# Patient Record
Sex: Male | Born: 1990 | Race: Black or African American | Hispanic: No | Smoking: Never smoker
Health system: Southern US, Community
[De-identification: ages and names within clinical notes are randomized; demographics above are authoritative.]

## PROBLEM LIST (undated history)

## (undated) DIAGNOSIS — W3400XA Accidental discharge from unspecified firearms or gun, initial encounter: Secondary | ICD-10-CM

## (undated) DIAGNOSIS — Z9289 Personal history of other medical treatment: Secondary | ICD-10-CM

## (undated) DIAGNOSIS — Z8489 Family history of other specified conditions: Secondary | ICD-10-CM

## (undated) DIAGNOSIS — K56609 Unspecified intestinal obstruction, unspecified as to partial versus complete obstruction: Secondary | ICD-10-CM

---
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
## 2010-11-13 ENCOUNTER — Encounter: Payer: Self-pay | Admitting: General Surgery

## 2011-02-02 LAB — CULTURE, ROUTINE-ABSCESS

## 2011-02-02 LAB — COMPREHENSIVE METABOLIC PANEL
ALT: 36 U/L (ref 0–53)
AST: 25 U/L (ref 0–37)
Albumin: 2 g/dL — ABNORMAL LOW (ref 3.5–5.2)
Albumin: 2.1 g/dL — ABNORMAL LOW (ref 3.5–5.2)
Albumin: 2.4 g/dL — ABNORMAL LOW (ref 3.5–5.2)
Alkaline Phosphatase: 137 U/L (ref 52–171)
BUN: 10 mg/dL (ref 6–23)
BUN: 10 mg/dL (ref 6–23)
Calcium: 7.9 mg/dL — ABNORMAL LOW (ref 8.4–10.5)
Chloride: 100 mEq/L (ref 96–112)
Chloride: 99 mEq/L (ref 96–112)
Creatinine, Ser: 0.71 mg/dL (ref 0.4–1.5)
Creatinine, Ser: 0.84 mg/dL (ref 0.4–1.5)
Glucose, Bld: 121 mg/dL — ABNORMAL HIGH (ref 70–99)
Potassium: 3.9 mEq/L (ref 3.5–5.1)
Sodium: 133 mEq/L — ABNORMAL LOW (ref 135–145)
Total Bilirubin: 0.6 mg/dL (ref 0.3–1.2)
Total Bilirubin: 1.2 mg/dL (ref 0.3–1.2)
Total Protein: 5.4 g/dL — ABNORMAL LOW (ref 6.0–8.3)
Total Protein: 6.3 g/dL (ref 6.0–8.3)

## 2011-02-02 LAB — DIFFERENTIAL
Basophils Absolute: 0 10*3/uL (ref 0.0–0.1)
Basophils Absolute: 0 10*3/uL (ref 0.0–0.1)
Basophils Relative: 0 % (ref 0–1)
Eosinophils Absolute: 0.1 10*3/uL (ref 0.0–1.2)
Eosinophils Relative: 0 % (ref 0–5)
Lymphocytes Relative: 5 % — ABNORMAL LOW (ref 24–48)
Lymphocytes Relative: 6 % — ABNORMAL LOW (ref 24–48)
Lymphocytes Relative: 6 % — ABNORMAL LOW (ref 24–48)
Lymphs Abs: 0.9 10*3/uL — ABNORMAL LOW (ref 1.1–4.8)
Lymphs Abs: 1 10*3/uL — ABNORMAL LOW (ref 1.1–4.8)
Neutro Abs: 15 10*3/uL — ABNORMAL HIGH (ref 1.7–8.0)
Neutro Abs: 15.2 10*3/uL — ABNORMAL HIGH (ref 1.7–8.0)
Neutro Abs: 16.9 10*3/uL — ABNORMAL HIGH (ref 1.7–8.0)
Neutrophils Relative %: 88 % — ABNORMAL HIGH (ref 43–71)
Neutrophils Relative %: 88 % — ABNORMAL HIGH (ref 43–71)
Neutrophils Relative %: 90 % — ABNORMAL HIGH (ref 43–71)

## 2011-02-02 LAB — CBC
HCT: 23 % — ABNORMAL LOW (ref 36.0–49.0)
HCT: 31 % — ABNORMAL LOW (ref 36.0–49.0)
HCT: 31.1 % — ABNORMAL LOW (ref 36.0–49.0)
Hemoglobin: 10.5 g/dL — ABNORMAL LOW (ref 12.0–16.0)
MCHC: 34.2 g/dL (ref 31.0–37.0)
MCHC: 34.4 g/dL (ref 31.0–37.0)
MCHC: 34.7 g/dL (ref 31.0–37.0)
MCV: 87.3 fL (ref 78.0–98.0)
MCV: 87.6 fL (ref 78.0–98.0)
MCV: 88.2 fL (ref 78.0–98.0)
MCV: 88.3 fL (ref 78.0–98.0)
Platelets: 192 10*3/uL (ref 150–400)
Platelets: 236 10*3/uL (ref 150–400)
Platelets: 429 10*3/uL — ABNORMAL HIGH (ref 150–400)
Platelets: 493 10*3/uL — ABNORMAL HIGH (ref 150–400)
Platelets: 539 10*3/uL — ABNORMAL HIGH (ref 150–400)
Platelets: 582 10*3/uL — ABNORMAL HIGH (ref 150–400)
RBC: 2.64 MIL/uL — ABNORMAL LOW (ref 3.80–5.70)
RBC: 3 MIL/uL — ABNORMAL LOW (ref 3.80–5.70)
RBC: 3.45 MIL/uL — ABNORMAL LOW (ref 3.80–5.70)
RBC: 3.49 MIL/uL — ABNORMAL LOW (ref 3.80–5.70)
RDW: 16.9 % — ABNORMAL HIGH (ref 11.4–15.5)
RDW: 17.4 % — ABNORMAL HIGH (ref 11.4–15.5)
RDW: 17.4 % — ABNORMAL HIGH (ref 11.4–15.5)
RDW: 17.7 % — ABNORMAL HIGH (ref 11.4–15.5)
WBC: 13.4 10*3/uL (ref 4.5–13.5)
WBC: 13.7 10*3/uL — ABNORMAL HIGH (ref 4.5–13.5)
WBC: 15.2 10*3/uL — ABNORMAL HIGH (ref 4.5–13.5)
WBC: 17.1 10*3/uL — ABNORMAL HIGH (ref 4.5–13.5)
WBC: 18.8 10*3/uL — ABNORMAL HIGH (ref 4.5–13.5)
WBC: 9.1 10*3/uL (ref 4.5–13.5)

## 2011-02-02 LAB — GLUCOSE, CAPILLARY
Glucose-Capillary: 105 mg/dL — ABNORMAL HIGH (ref 70–99)
Glucose-Capillary: 105 mg/dL — ABNORMAL HIGH (ref 70–99)
Glucose-Capillary: 116 mg/dL — ABNORMAL HIGH (ref 70–99)
Glucose-Capillary: 119 mg/dL — ABNORMAL HIGH (ref 70–99)
Glucose-Capillary: 120 mg/dL — ABNORMAL HIGH (ref 70–99)
Glucose-Capillary: 121 mg/dL — ABNORMAL HIGH (ref 70–99)
Glucose-Capillary: 122 mg/dL — ABNORMAL HIGH (ref 70–99)
Glucose-Capillary: 123 mg/dL — ABNORMAL HIGH (ref 70–99)
Glucose-Capillary: 124 mg/dL — ABNORMAL HIGH (ref 70–99)
Glucose-Capillary: 125 mg/dL — ABNORMAL HIGH (ref 70–99)
Glucose-Capillary: 128 mg/dL — ABNORMAL HIGH (ref 70–99)
Glucose-Capillary: 128 mg/dL — ABNORMAL HIGH (ref 70–99)
Glucose-Capillary: 135 mg/dL — ABNORMAL HIGH (ref 70–99)
Glucose-Capillary: 138 mg/dL — ABNORMAL HIGH (ref 70–99)
Glucose-Capillary: 142 mg/dL — ABNORMAL HIGH (ref 70–99)
Glucose-Capillary: 142 mg/dL — ABNORMAL HIGH (ref 70–99)
Glucose-Capillary: 153 mg/dL — ABNORMAL HIGH (ref 70–99)
Glucose-Capillary: 80 mg/dL (ref 70–99)
Glucose-Capillary: 87 mg/dL (ref 70–99)
Glucose-Capillary: 96 mg/dL (ref 70–99)

## 2011-02-02 LAB — BASIC METABOLIC PANEL
BUN: 10 mg/dL (ref 6–23)
BUN: 10 mg/dL (ref 6–23)
Calcium: 8 mg/dL — ABNORMAL LOW (ref 8.4–10.5)
Calcium: 8.3 mg/dL — ABNORMAL LOW (ref 8.4–10.5)
Calcium: 8.6 mg/dL (ref 8.4–10.5)
Chloride: 103 mEq/L (ref 96–112)
Creatinine, Ser: 0.77 mg/dL (ref 0.4–1.5)
Creatinine, Ser: 0.91 mg/dL (ref 0.4–1.5)
Glucose, Bld: 122 mg/dL — ABNORMAL HIGH (ref 70–99)
Potassium: 4.1 mEq/L (ref 3.5–5.1)
Sodium: 133 mEq/L — ABNORMAL LOW (ref 135–145)

## 2011-02-02 LAB — APTT: aPTT: 41 seconds — ABNORMAL HIGH (ref 24–37)

## 2011-02-02 LAB — PHOSPHORUS
Phosphorus: 4.8 mg/dL — ABNORMAL HIGH (ref 2.3–4.6)
Phosphorus: 4.8 mg/dL — ABNORMAL HIGH (ref 2.3–4.6)
Phosphorus: 4.9 mg/dL — ABNORMAL HIGH (ref 2.3–4.6)

## 2011-02-02 LAB — CROSSMATCH
ABO/RH(D): O POS
Antibody Screen: NEGATIVE

## 2011-02-02 LAB — PREALBUMIN: Prealbumin: 16.7 mg/dL — ABNORMAL LOW (ref 18.0–45.0)

## 2011-02-02 LAB — MAGNESIUM
Magnesium: 2 mg/dL (ref 1.5–2.5)
Magnesium: 2.1 mg/dL (ref 1.5–2.5)

## 2011-02-02 LAB — TRIGLYCERIDES
Triglycerides: 55 mg/dL (ref <150)
Triglycerides: 56 mg/dL (ref <150)

## 2011-02-02 LAB — CHOLESTEROL, TOTAL: Cholesterol: 63 mg/dL (ref 0–169)

## 2011-02-02 LAB — PROTIME-INR: Prothrombin Time: 18.9 seconds — ABNORMAL HIGH (ref 11.6–15.2)

## 2011-02-07 LAB — COMPREHENSIVE METABOLIC PANEL
ALT: 15 U/L (ref 0–53)
ALT: 13 U/L (ref 0–53)
AST: 17 U/L (ref 0–37)
AST: 14 U/L (ref 0–37)
Albumin: 3.8 g/dL (ref 3.5–5.2)
Albumin: 3.2 g/dL — ABNORMAL LOW (ref 3.5–5.2)
Albumin: 3.5 g/dL (ref 3.5–5.2)
Alkaline Phosphatase: 54 U/L (ref 52–171)
Alkaline Phosphatase: 41 U/L — ABNORMAL LOW (ref 52–171)
Alkaline Phosphatase: 54 U/L (ref 52–171)
BUN: 10 mg/dL (ref 6–23)
BUN: 14 mg/dL (ref 6–23)
BUN: 5 mg/dL — ABNORMAL LOW (ref 6–23)
CO2: 27 mEq/L (ref 19–32)
Calcium: 8 mg/dL — ABNORMAL LOW (ref 8.4–10.5)
Calcium: 8.6 mg/dL (ref 8.4–10.5)
Calcium: 8.9 mg/dL (ref 8.4–10.5)
Chloride: 101 mEq/L (ref 96–112)
Chloride: 102 mEq/L (ref 96–112)
Creatinine, Ser: 1.02 mg/dL (ref 0.4–1.5)
Creatinine, Ser: 0.74 mg/dL (ref 0.4–1.5)
Creatinine, Ser: 0.82 mg/dL (ref 0.4–1.5)
Creatinine, Ser: 0.92 mg/dL (ref 0.4–1.5)
Glucose, Bld: 118 mg/dL — ABNORMAL HIGH (ref 70–99)
Glucose, Bld: 136 mg/dL — ABNORMAL HIGH (ref 70–99)
Glucose, Bld: 160 mg/dL — ABNORMAL HIGH (ref 70–99)
Potassium: 3.7 mEq/L (ref 3.5–5.1)
Potassium: 3.7 mEq/L (ref 3.5–5.1)
Potassium: 4.2 mEq/L (ref 3.5–5.1)
Sodium: 137 mEq/L (ref 135–145)
Sodium: 137 mEq/L (ref 135–145)
Total Bilirubin: 0.6 mg/dL (ref 0.3–1.2)
Total Bilirubin: 0.4 mg/dL (ref 0.3–1.2)
Total Protein: 5.2 g/dL — ABNORMAL LOW (ref 6.0–8.3)
Total Protein: 5.7 g/dL — ABNORMAL LOW (ref 6.0–8.3)
Total Protein: 6.9 g/dL (ref 6.0–8.3)

## 2011-02-07 LAB — GLUCOSE, CAPILLARY
Glucose-Capillary: 105 mg/dL — ABNORMAL HIGH (ref 70–99)
Glucose-Capillary: 111 mg/dL — ABNORMAL HIGH (ref 70–99)
Glucose-Capillary: 111 mg/dL — ABNORMAL HIGH (ref 70–99)
Glucose-Capillary: 112 mg/dL — ABNORMAL HIGH (ref 70–99)
Glucose-Capillary: 112 mg/dL — ABNORMAL HIGH (ref 70–99)
Glucose-Capillary: 116 mg/dL — ABNORMAL HIGH (ref 70–99)
Glucose-Capillary: 121 mg/dL — ABNORMAL HIGH (ref 70–99)
Glucose-Capillary: 123 mg/dL — ABNORMAL HIGH (ref 70–99)
Glucose-Capillary: 125 mg/dL — ABNORMAL HIGH (ref 70–99)
Glucose-Capillary: 127 mg/dL — ABNORMAL HIGH (ref 70–99)
Glucose-Capillary: 127 mg/dL — ABNORMAL HIGH (ref 70–99)
Glucose-Capillary: 129 mg/dL — ABNORMAL HIGH (ref 70–99)
Glucose-Capillary: 130 mg/dL — ABNORMAL HIGH (ref 70–99)
Glucose-Capillary: 132 mg/dL — ABNORMAL HIGH (ref 70–99)
Glucose-Capillary: 133 mg/dL — ABNORMAL HIGH (ref 70–99)
Glucose-Capillary: 133 mg/dL — ABNORMAL HIGH (ref 70–99)
Glucose-Capillary: 135 mg/dL — ABNORMAL HIGH (ref 70–99)
Glucose-Capillary: 135 mg/dL — ABNORMAL HIGH (ref 70–99)
Glucose-Capillary: 139 mg/dL — ABNORMAL HIGH (ref 70–99)
Glucose-Capillary: 139 mg/dL — ABNORMAL HIGH (ref 70–99)
Glucose-Capillary: 143 mg/dL — ABNORMAL HIGH (ref 70–99)
Glucose-Capillary: 144 mg/dL — ABNORMAL HIGH (ref 70–99)
Glucose-Capillary: 146 mg/dL — ABNORMAL HIGH (ref 70–99)
Glucose-Capillary: 147 mg/dL — ABNORMAL HIGH (ref 70–99)
Glucose-Capillary: 148 mg/dL — ABNORMAL HIGH (ref 70–99)
Glucose-Capillary: 152 mg/dL — ABNORMAL HIGH (ref 70–99)
Glucose-Capillary: 155 mg/dL — ABNORMAL HIGH (ref 70–99)
Glucose-Capillary: 159 mg/dL — ABNORMAL HIGH (ref 70–99)
Glucose-Capillary: 160 mg/dL — ABNORMAL HIGH (ref 70–99)
Glucose-Capillary: 163 mg/dL — ABNORMAL HIGH (ref 70–99)
Glucose-Capillary: 167 mg/dL — ABNORMAL HIGH (ref 70–99)
Glucose-Capillary: 169 mg/dL — ABNORMAL HIGH (ref 70–99)
Glucose-Capillary: 175 mg/dL — ABNORMAL HIGH (ref 70–99)
Glucose-Capillary: 182 mg/dL — ABNORMAL HIGH (ref 70–99)
Glucose-Capillary: 220 mg/dL — ABNORMAL HIGH (ref 70–99)
Glucose-Capillary: 96 mg/dL (ref 70–99)

## 2011-02-07 LAB — BASIC METABOLIC PANEL
BUN: 10 mg/dL (ref 6–23)
BUN: 17 mg/dL (ref 6–23)
BUN: 6 mg/dL (ref 6–23)
BUN: 12 mg/dL (ref 6–23)
BUN: 13 mg/dL (ref 6–23)
BUN: 3 mg/dL — ABNORMAL LOW (ref 6–23)
CO2: 24 mEq/L (ref 19–32)
CO2: 24 mEq/L (ref 19–32)
CO2: 27 mEq/L (ref 19–32)
CO2: 28 mEq/L (ref 19–32)
CO2: 28 mEq/L (ref 19–32)
CO2: 25 mEq/L (ref 19–32)
CO2: 25 mEq/L (ref 19–32)
CO2: 26 mEq/L (ref 19–32)
CO2: 27 mEq/L (ref 19–32)
Calcium: 7.4 mg/dL — ABNORMAL LOW (ref 8.4–10.5)
Calcium: 7.5 mg/dL — ABNORMAL LOW (ref 8.4–10.5)
Calcium: 7.7 mg/dL — ABNORMAL LOW (ref 8.4–10.5)
Calcium: 8.3 mg/dL — ABNORMAL LOW (ref 8.4–10.5)
Calcium: 8.8 mg/dL (ref 8.4–10.5)
Calcium: 8.4 mg/dL (ref 8.4–10.5)
Calcium: 8.7 mg/dL (ref 8.4–10.5)
Calcium: 8.7 mg/dL (ref 8.4–10.5)
Chloride: 102 mEq/L (ref 96–112)
Chloride: 102 mEq/L (ref 96–112)
Chloride: 110 mEq/L (ref 96–112)
Chloride: 100 mEq/L (ref 96–112)
Chloride: 100 mEq/L (ref 96–112)
Chloride: 101 mEq/L (ref 96–112)
Chloride: 101 mEq/L (ref 96–112)
Chloride: 103 mEq/L (ref 96–112)
Chloride: 97 mEq/L (ref 96–112)
Chloride: 98 mEq/L (ref 96–112)
Creatinine, Ser: 0.82 mg/dL (ref 0.4–1.5)
Creatinine, Ser: 0.99 mg/dL (ref 0.4–1.5)
Creatinine, Ser: 1.49 mg/dL (ref 0.4–1.5)
Creatinine, Ser: 0.77 mg/dL (ref 0.4–1.5)
Creatinine, Ser: 0.81 mg/dL (ref 0.4–1.5)
Creatinine, Ser: 0.93 mg/dL (ref 0.4–1.5)
Glucose, Bld: 103 mg/dL — ABNORMAL HIGH (ref 70–99)
Glucose, Bld: 124 mg/dL — ABNORMAL HIGH (ref 70–99)
Glucose, Bld: 116 mg/dL — ABNORMAL HIGH (ref 70–99)
Glucose, Bld: 123 mg/dL — ABNORMAL HIGH (ref 70–99)
Glucose, Bld: 125 mg/dL — ABNORMAL HIGH (ref 70–99)
Glucose, Bld: 131 mg/dL — ABNORMAL HIGH (ref 70–99)
Glucose, Bld: 163 mg/dL — ABNORMAL HIGH (ref 70–99)
Glucose, Bld: 186 mg/dL — ABNORMAL HIGH (ref 70–99)
Potassium: 4.7 mEq/L (ref 3.5–5.1)
Potassium: 6.7 mEq/L (ref 3.5–5.1)
Potassium: 4.1 mEq/L (ref 3.5–5.1)
Potassium: 4.3 mEq/L (ref 3.5–5.1)
Potassium: 4.5 mEq/L (ref 3.5–5.1)
Potassium: 4.6 mEq/L (ref 3.5–5.1)
Potassium: 4.8 mEq/L (ref 3.5–5.1)
Sodium: 133 mEq/L — ABNORMAL LOW (ref 135–145)
Sodium: 133 mEq/L — ABNORMAL LOW (ref 135–145)
Sodium: 134 mEq/L — ABNORMAL LOW (ref 135–145)
Sodium: 136 mEq/L (ref 135–145)
Sodium: 138 mEq/L (ref 135–145)
Sodium: 131 mEq/L — ABNORMAL LOW (ref 135–145)
Sodium: 133 mEq/L — ABNORMAL LOW (ref 135–145)
Sodium: 134 mEq/L — ABNORMAL LOW (ref 135–145)
Sodium: 135 mEq/L (ref 135–145)
Sodium: 135 mEq/L (ref 135–145)

## 2011-02-07 LAB — URINALYSIS, ROUTINE W REFLEX MICROSCOPIC
Bilirubin Urine: NEGATIVE
Glucose, UA: NEGATIVE mg/dL
Glucose, UA: 100 mg/dL — AB
Hgb urine dipstick: NEGATIVE
Nitrite: NEGATIVE
Specific Gravity, Urine: >1.046 — ABNORMAL HIGH (ref 1.005–1.030)
Specific Gravity, Urine: 1.028 (ref 1.005–1.030)
pH: 7 (ref 5.0–8.0)

## 2011-02-07 LAB — DIFFERENTIAL
Basophils Absolute: 0 10*3/uL (ref 0.0–0.1)
Basophils Absolute: 0.1 10*3/uL (ref 0.0–0.1)
Basophils Absolute: 0 10*3/uL (ref 0.0–0.1)
Basophils Absolute: 0 10*3/uL (ref 0.0–0.1)
Basophils Absolute: 0 10*3/uL (ref 0.0–0.1)
Basophils Absolute: 0 10*3/uL (ref 0.0–0.1)
Basophils Relative: 0 % (ref 0–1)
Basophils Relative: 0 % (ref 0–1)
Basophils Relative: 0 % (ref 0–1)
Basophils Relative: 0 % (ref 0–1)
Basophils Relative: 1 % (ref 0–1)
Eosinophils Absolute: 0 10*3/uL (ref 0.0–1.2)
Eosinophils Absolute: 0 10*3/uL (ref 0.0–1.2)
Eosinophils Absolute: 0 10*3/uL (ref 0.0–1.2)
Eosinophils Absolute: 0.1 10*3/uL (ref 0.0–1.2)
Eosinophils Relative: 0 % (ref 0–5)
Eosinophils Relative: 0 % (ref 0–5)
Eosinophils Relative: 0 % (ref 0–5)
Eosinophils Relative: 0 % (ref 0–5)
Eosinophils Relative: 1 % (ref 0–5)
Lymphocytes Relative: 15 % — ABNORMAL LOW (ref 24–48)
Lymphocytes Relative: 6 % — ABNORMAL LOW (ref 24–48)
Lymphocytes Relative: 14 % — ABNORMAL LOW (ref 24–48)
Lymphocytes Relative: 6 % — ABNORMAL LOW (ref 24–48)
Lymphocytes Relative: 8 % — ABNORMAL LOW (ref 24–48)
Lymphocytes Relative: 9 % — ABNORMAL LOW (ref 24–48)
Lymphs Abs: 1.5 10*3/uL (ref 1.1–4.8)
Lymphs Abs: 0.5 10*3/uL — ABNORMAL LOW (ref 1.1–4.8)
Lymphs Abs: 0.6 10*3/uL — ABNORMAL LOW (ref 1.1–4.8)
Lymphs Abs: 0.9 10*3/uL — ABNORMAL LOW (ref 1.1–4.8)
Lymphs Abs: 1 10*3/uL — ABNORMAL LOW (ref 1.1–4.8)
Lymphs Abs: 1.3 10*3/uL (ref 1.1–4.8)
Monocytes Absolute: 0.5 10*3/uL (ref 0.2–1.2)
Monocytes Absolute: 0.6 10*3/uL (ref 0.2–1.2)
Monocytes Absolute: 0.5 10*3/uL (ref 0.2–1.2)
Monocytes Absolute: 2.2 10*3/uL — ABNORMAL HIGH (ref 0.2–1.2)
Monocytes Relative: 16 % — ABNORMAL HIGH (ref 3–11)
Monocytes Relative: 17 % — ABNORMAL HIGH (ref 3–11)
Monocytes Relative: 5 % (ref 3–11)
Neutro Abs: 7.6 10*3/uL (ref 1.7–8.0)
Neutro Abs: 11.4 10*3/uL — ABNORMAL HIGH (ref 1.7–8.0)
Neutro Abs: 7.3 10*3/uL (ref 1.7–8.0)
Neutro Abs: 7.4 10*3/uL (ref 1.7–8.0)
Neutro Abs: 9 10*3/uL — ABNORMAL HIGH (ref 1.7–8.0)
Neutrophils Relative %: 70 % (ref 43–71)
Neutrophils Relative %: 74 % — ABNORMAL HIGH (ref 43–71)
Neutrophils Relative %: 79 % — ABNORMAL HIGH (ref 43–71)
Neutrophils Relative %: 79 % — ABNORMAL HIGH (ref 43–71)
Neutrophils Relative %: 86 % — ABNORMAL HIGH (ref 43–71)
Neutrophils Relative %: 90 % — ABNORMAL HIGH (ref 43–71)
WBC Morphology: INCREASED

## 2011-02-07 LAB — URINE CULTURE
Colony Count: NO GROWTH
Colony Count: NO GROWTH
Culture: NO GROWTH

## 2011-02-07 LAB — MAGNESIUM: Magnesium: 2.1 mg/dL (ref 1.5–2.5)

## 2011-02-07 LAB — PHOSPHORUS
Phosphorus: 3 mg/dL (ref 2.3–4.6)
Phosphorus: 3.9 mg/dL (ref 2.3–4.6)
Phosphorus: 4.1 mg/dL (ref 2.3–4.6)

## 2011-02-07 LAB — CBC
HCT: 22.5 % — ABNORMAL LOW (ref 36.0–49.0)
HCT: 25.7 % — ABNORMAL LOW (ref 36.0–49.0)
HCT: 32.5 % — ABNORMAL LOW (ref 36.0–49.0)
HCT: 46.3 % (ref 36.0–49.0)
HCT: 20.8 % — ABNORMAL LOW (ref 36.0–49.0)
HCT: 24.9 % — ABNORMAL LOW (ref 36.0–49.0)
HCT: 25.3 % — ABNORMAL LOW (ref 36.0–49.0)
HCT: 25.5 % — ABNORMAL LOW (ref 36.0–49.0)
HCT: 26.8 % — ABNORMAL LOW (ref 36.0–49.0)
HCT: 28.1 % — ABNORMAL LOW (ref 36.0–49.0)
HCT: 28.9 % — ABNORMAL LOW (ref 36.0–49.0)
Hemoglobin: 11.4 g/dL — ABNORMAL LOW (ref 12.0–16.0)
Hemoglobin: 15.7 g/dL (ref 12.0–16.0)
Hemoglobin: 6.3 g/dL — CL (ref 12.0–16.0)
Hemoglobin: 7.8 g/dL — CL (ref 12.0–16.0)
Hemoglobin: 8.6 g/dL — ABNORMAL LOW (ref 12.0–16.0)
Hemoglobin: 8.9 g/dL — ABNORMAL LOW (ref 12.0–16.0)
Hemoglobin: 7 g/dL — CL (ref 12.0–16.0)
Hemoglobin: 8.6 g/dL — ABNORMAL LOW (ref 12.0–16.0)
Hemoglobin: 8.6 g/dL — ABNORMAL LOW (ref 12.0–16.0)
Hemoglobin: 8.6 g/dL — ABNORMAL LOW (ref 12.0–16.0)
Hemoglobin: 8.9 g/dL — ABNORMAL LOW (ref 12.0–16.0)
Hemoglobin: 9.4 g/dL — ABNORMAL LOW (ref 12.0–16.0)
Hemoglobin: 9.8 g/dL — ABNORMAL LOW (ref 12.0–16.0)
MCHC: 34.7 g/dL (ref 31.0–37.0)
MCHC: 34.9 g/dL (ref 31.0–37.0)
MCHC: 35.2 g/dL (ref 31.0–37.0)
MCHC: 35.3 g/dL (ref 31.0–37.0)
MCHC: 35.3 g/dL (ref 31.0–37.0)
MCHC: 33.4 g/dL (ref 31.0–37.0)
MCHC: 33.5 g/dL (ref 31.0–37.0)
MCHC: 34 g/dL (ref 31.0–37.0)
MCHC: 34.4 g/dL (ref 31.0–37.0)
MCHC: 34.4 g/dL (ref 31.0–37.0)
MCHC: 34.4 g/dL (ref 31.0–37.0)
MCHC: 34.5 g/dL (ref 31.0–37.0)
MCHC: 34.9 g/dL (ref 31.0–37.0)
MCHC: 35.2 g/dL (ref 31.0–37.0)
MCV: 93.1 fL (ref 78.0–98.0)
MCV: 93.5 fL (ref 78.0–98.0)
MCV: 94.4 fL (ref 78.0–98.0)
MCV: 87.4 fL (ref 78.0–98.0)
MCV: 87.4 fL (ref 78.0–98.0)
MCV: 87.5 fL (ref 78.0–98.0)
MCV: 87.7 fL (ref 78.0–98.0)
MCV: 88.6 fL (ref 78.0–98.0)
MCV: 88.8 fL (ref 78.0–98.0)
MCV: 90.1 fL (ref 78.0–98.0)
MCV: 90.1 fL (ref 78.0–98.0)
MCV: 91.7 fL (ref 78.0–98.0)
MCV: 92 fL (ref 78.0–98.0)
Platelets: 123 10*3/uL — ABNORMAL LOW (ref 150–400)
Platelets: 133 10*3/uL — ABNORMAL LOW (ref 150–400)
Platelets: 168 10*3/uL (ref 150–400)
Platelets: 500 10*3/uL — ABNORMAL HIGH (ref 150–400)
Platelets: 289 10*3/uL (ref 150–400)
Platelets: 321 10*3/uL (ref 150–400)
Platelets: 416 10*3/uL — ABNORMAL HIGH (ref 150–400)
Platelets: 431 10*3/uL — ABNORMAL HIGH (ref 150–400)
Platelets: 440 10*3/uL — ABNORMAL HIGH (ref 150–400)
Platelets: 502 10*3/uL — ABNORMAL HIGH (ref 150–400)
RBC: 2.42 MIL/uL — ABNORMAL LOW (ref 3.80–5.70)
RBC: 2.44 MIL/uL — ABNORMAL LOW (ref 3.80–5.70)
RBC: 2.64 MIL/uL — ABNORMAL LOW (ref 3.80–5.70)
RBC: 2.8 MIL/uL — ABNORMAL LOW (ref 3.80–5.70)
RBC: 3.45 MIL/uL — ABNORMAL LOW (ref 3.80–5.70)
RBC: 4.92 MIL/uL (ref 3.80–5.70)
RBC: 2.74 MIL/uL — ABNORMAL LOW (ref 3.80–5.70)
RBC: 2.8 MIL/uL — ABNORMAL LOW (ref 3.80–5.70)
RBC: 2.81 MIL/uL — ABNORMAL LOW (ref 3.80–5.70)
RBC: 2.89 MIL/uL — ABNORMAL LOW (ref 3.80–5.70)
RBC: 2.98 MIL/uL — ABNORMAL LOW (ref 3.80–5.70)
RBC: 3.14 MIL/uL — ABNORMAL LOW (ref 3.80–5.70)
RBC: 3.4 MIL/uL — ABNORMAL LOW (ref 3.80–5.70)
RDW: 12.9 % (ref 11.4–15.5)
RDW: 13 % (ref 11.4–15.5)
RDW: 13.2 % (ref 11.4–15.5)
RDW: 13.2 % (ref 11.4–15.5)
RDW: 13.4 % (ref 11.4–15.5)
RDW: 13.4 % (ref 11.4–15.5)
RDW: 14 % (ref 11.4–15.5)
RDW: 15.1 % (ref 11.4–15.5)
RDW: 15.2 % (ref 11.4–15.5)
RDW: 15.9 % — ABNORMAL HIGH (ref 11.4–15.5)
RDW: 16.1 % — ABNORMAL HIGH (ref 11.4–15.5)
WBC: 10.1 10*3/uL (ref 4.5–13.5)
WBC: 10.5 10*3/uL (ref 4.5–13.5)
WBC: 20.9 10*3/uL — ABNORMAL HIGH (ref 4.5–13.5)
WBC: 6.5 10*3/uL (ref 4.5–13.5)
WBC: 8.7 10*3/uL (ref 4.5–13.5)
WBC: 9.7 10*3/uL (ref 4.5–13.5)
WBC: 10 10*3/uL (ref 4.5–13.5)
WBC: 11 10*3/uL (ref 4.5–13.5)
WBC: 12 10*3/uL (ref 4.5–13.5)
WBC: 13.8 10*3/uL — ABNORMAL HIGH (ref 4.5–13.5)
WBC: 18.6 10*3/uL — ABNORMAL HIGH (ref 4.5–13.5)
WBC: 7.4 10*3/uL (ref 4.5–13.5)
WBC: 9.3 10*3/uL (ref 4.5–13.5)

## 2011-02-07 LAB — CULTURE, BLOOD (ROUTINE X 2): Culture: NO GROWTH

## 2011-02-07 LAB — WOUND CULTURE

## 2011-02-07 LAB — CROSSMATCH: Antibody Screen: NEGATIVE

## 2011-02-07 LAB — URINE MICROSCOPIC-ADD ON

## 2011-02-07 LAB — HEMOCCULT GUIAC POC 1CARD (OFFICE): Fecal Occult Bld: NEGATIVE

## 2011-02-07 LAB — APTT
aPTT: 26 seconds (ref 24–37)
aPTT: 30 seconds (ref 24–37)

## 2011-02-07 LAB — LACTIC ACID, PLASMA: Lactic Acid, Venous: 1.7 mmol/L (ref 0.5–2.2)

## 2011-02-07 LAB — CHOLESTEROL, TOTAL
Cholesterol: 78 mg/dL (ref 0–169)
Cholesterol: 91 mg/dL (ref 0–169)

## 2011-02-07 LAB — POCT I-STAT 4, (NA,K, GLUC, HGB,HCT)
Glucose, Bld: 162 mg/dL — ABNORMAL HIGH (ref 70–99)
HCT: 28 % — ABNORMAL LOW (ref 36.0–49.0)
Hemoglobin: 9.5 g/dL — ABNORMAL LOW (ref 12.0–16.0)
Potassium: 6.4 mEq/L (ref 3.5–5.1)
Sodium: 134 mEq/L — ABNORMAL LOW (ref 135–145)

## 2011-02-07 LAB — PROTIME-INR
INR: 1.1 (ref 0.00–1.49)
INR: 1.3 (ref 0.00–1.49)
Prothrombin Time: 14.8 seconds (ref 11.6–15.2)
Prothrombin Time: 18.3 seconds — ABNORMAL HIGH (ref 11.6–15.2)
Prothrombin Time: 16.4 seconds — ABNORMAL HIGH (ref 11.6–15.2)
Prothrombin Time: 16.8 seconds — ABNORMAL HIGH (ref 11.6–15.2)

## 2011-02-07 LAB — GASTRIC OCCULT BLOOD (1-CARD TO LAB)
Occult Blood, Gastric: POSITIVE — AB
Occult Blood, Gastric: POSITIVE — AB
pH, Gastric: 5

## 2011-02-07 LAB — TRIGLYCERIDES
Triglycerides: 40 mg/dL (ref <150)
Triglycerides: 70 mg/dL (ref <150)

## 2011-02-07 LAB — PREALBUMIN
Prealbumin: 15.4 mg/dL — ABNORMAL LOW (ref 18.0–45.0)
Prealbumin: 19.5 mg/dL (ref 18.0–45.0)

## 2011-02-07 LAB — HEMOGLOBIN AND HEMATOCRIT, BLOOD: HCT: 22.9 % — ABNORMAL LOW (ref 36.0–49.0)
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
## 2011-07-20 LAB — TYPE AND SCREEN: ABO/RH(D): O POS

## 2011-07-20 LAB — CBC
HCT: 23.7 — ABNORMAL LOW
HCT: 24.1 — ABNORMAL LOW
Hemoglobin: 14.4
Hemoglobin: 8.4 — ABNORMAL LOW
MCV: 90.7
Platelets: 113 — ABNORMAL LOW
RBC: 2.65 — ABNORMAL LOW
RBC: 4.51
WBC: 10.9
WBC: 11.9
WBC: 14 — ABNORMAL HIGH

## 2011-07-20 LAB — POCT I-STAT 4, (NA,K, GLUC, HGB,HCT)
Glucose, Bld: 134 — ABNORMAL HIGH
HCT: 30 — ABNORMAL LOW
Hemoglobin: 10.2 — ABNORMAL LOW
Operator id: 249321
Potassium: 4

## 2011-07-20 LAB — BASIC METABOLIC PANEL
BUN: 6
CO2: 28
Chloride: 99
Glucose, Bld: 147 — ABNORMAL HIGH
Potassium: 4.1
Potassium: 4.3
Sodium: 136

## 2011-07-20 LAB — POCT I-STAT, CHEM 8
BUN: 17
Calcium, Ion: 1.16
Chloride: 104
Glucose, Bld: 143 — ABNORMAL HIGH
HCT: 43
TCO2: 18

## 2011-07-20 LAB — POCT CARDIAC MARKERS: Troponin i, poc: <0.05

## 2011-07-20 LAB — PROTIME-INR
INR: 1.2
Prothrombin Time: 15.5 — ABNORMAL HIGH

## 2011-07-20 LAB — ABO/RH: ABO/RH(D): O POS

## 2011-07-21 LAB — COMPREHENSIVE METABOLIC PANEL
ALT: 28
AST: 30
Albumin: 2.1 — ABNORMAL LOW
Albumin: 3.2 — ABNORMAL LOW
Alkaline Phosphatase: 62
BUN: 12
CO2: 26
Chloride: 94 — ABNORMAL LOW
Creatinine, Ser: 0.85
Creatinine, Ser: 0.93
Potassium: 4.2
Sodium: 131 — ABNORMAL LOW
Total Bilirubin: 1
Total Protein: 6.9

## 2011-07-21 LAB — BASIC METABOLIC PANEL
BUN: 7
BUN: 8
BUN: 8
CO2: 24
CO2: 27
CO2: 28
CO2: 28
Calcium: 7.7 — ABNORMAL LOW
Calcium: 8.3 — ABNORMAL LOW
Chloride: 96
Chloride: 96
Chloride: 98
Creatinine, Ser: 0.91
Creatinine, Ser: 0.91
Glucose, Bld: 111 — ABNORMAL HIGH
Glucose, Bld: 118 — ABNORMAL HIGH
Glucose, Bld: 118 — ABNORMAL HIGH
Glucose, Bld: 127 — ABNORMAL HIGH
Potassium: 3.9
Potassium: 4.2
Potassium: 4.2
Sodium: 131 — ABNORMAL LOW
Sodium: 131 — ABNORMAL LOW
Sodium: 137

## 2011-07-21 LAB — DIFFERENTIAL
Eosinophils Absolute: 0
Eosinophils Relative: 0
Lymphocytes Relative: 12 — ABNORMAL LOW
Lymphocytes Relative: 9 — ABNORMAL LOW
Lymphs Abs: 1.2
Lymphs Abs: 1.4
Monocytes Absolute: 0.3
Monocytes Absolute: 1.3 — ABNORMAL HIGH
Monocytes Relative: 3
Monocytes Relative: 9
Neutro Abs: 10.7 — ABNORMAL HIGH

## 2011-07-21 LAB — CBC
HCT: 18.4 — ABNORMAL LOW
HCT: 18.8 — ABNORMAL LOW
HCT: 20.8 — ABNORMAL LOW
HCT: 22.3 — ABNORMAL LOW
HCT: 30.1 — ABNORMAL LOW
Hemoglobin: 6.5 — CL
Hemoglobin: 6.6 — CL
Hemoglobin: 7.2 — CL
Hemoglobin: 7.5 — CL
Hemoglobin: 7.7 — CL
MCHC: 34.3
MCHC: 34.5
MCHC: 34.9
MCV: 89.1
MCV: 89.5
MCV: 90
MCV: 90
MCV: 90.7
MCV: 91.2
Platelets: 182
Platelets: 200
Platelets: 316
Platelets: 455 — ABNORMAL HIGH
Platelets: 563 — ABNORMAL HIGH
Platelets: 914
RBC: 2.07 — ABNORMAL LOW
RBC: 2.18 — ABNORMAL LOW
RBC: 2.26 — ABNORMAL LOW
RBC: 2.31 — ABNORMAL LOW
RBC: 2.35 — ABNORMAL LOW
RDW: 13.1
RDW: 13.5
RDW: 13.5
RDW: 14
RDW: 16.8 — ABNORMAL HIGH
WBC: 14.5 — ABNORMAL HIGH
WBC: 15.6 — ABNORMAL HIGH
WBC: 16.6 — ABNORMAL HIGH
WBC: 20.1 — ABNORMAL HIGH

## 2011-07-21 LAB — URINALYSIS, ROUTINE W REFLEX MICROSCOPIC
Glucose, UA: NEGATIVE
Leukocytes, UA: NEGATIVE
Protein, ur: NEGATIVE
Specific Gravity, Urine: 1.017
Specific Gravity, Urine: 1.028
Urobilinogen, UA: 1
pH: 7

## 2011-07-21 LAB — CULTURE, BLOOD (ROUTINE X 2): Culture: NO GROWTH

## 2011-07-21 LAB — WOUND CULTURE: Gram Stain: NONE SEEN

## 2011-07-21 LAB — RETICULOCYTES
Retic Count, Absolute: 28.3
Retic Ct Pct: 1.1

## 2011-07-21 LAB — CLOSTRIDIUM DIFFICILE EIA: C difficile Toxins A+B, EIA: NEGATIVE

## 2011-07-21 LAB — URINE MICROSCOPIC-ADD ON

## 2011-07-21 LAB — URINE CULTURE

## 2011-07-21 LAB — FERRITIN: Ferritin: 182 (ref 22–322)

## 2011-07-21 LAB — FOLATE: Folate: 4.5

## 2014-06-22 ENCOUNTER — Ambulatory Visit (INDEPENDENT_AMBULATORY_CARE_PROVIDER_SITE_OTHER): Payer: 59 | Admitting: Emergency Medicine

## 2014-06-22 VITALS — BP 126/90 | HR 48 | Temp 97.5°F | Resp 16 | Ht 69.25 in | Wt 186.4 lb

## 2014-06-22 DIAGNOSIS — Z202 Contact with and (suspected) exposure to infections with a predominantly sexual mode of transmission: Secondary | ICD-10-CM

## 2014-06-22 DIAGNOSIS — Z0000 Encounter for general adult medical examination without abnormal findings: Secondary | ICD-10-CM

## 2014-06-22 LAB — LIPID PANEL
CHOL/HDL RATIO: 2.4 ratio
Cholesterol: 169 mg/dL (ref 0–200)
HDL: 69 mg/dL (ref >39)
LDL CALC: 82 mg/dL (ref 0–99)
TRIGLYCERIDES: 92 mg/dL (ref <150)
VLDL: 18 mg/dL (ref 0–40)

## 2014-06-22 LAB — POCT CBC
GRANULOCYTE PERCENT: 63.1 % (ref 37–80)
HEMATOCRIT: 47.2 % (ref 43.5–53.7)
Hemoglobin: 15.4 g/dL (ref 14.1–18.1)
LYMPH, POC: 2.1 (ref 0.6–3.4)
MCH, POC: 30.5 pg (ref 27–31.2)
MCHC: 32.7 g/dL (ref 31.8–35.4)
MCV: 93.2 fL (ref 80–97)
MID (cbc): 0.4 (ref 0–0.9)
MPV: 8.5 fL (ref 0–99.8)
PLATELET COUNT, POC: 170 10*3/uL (ref 142–424)
POC GRANULOCYTE: 4.4 (ref 2–6.9)
POC LYMPH %: 31 % (ref 10–50)
POC MID %: 5.9 % (ref 0–12)
RBC: 5.06 M/uL (ref 4.69–6.13)
RDW, POC: 1435 %
WBC: 6.9 10*3/uL (ref 4.6–10.2)

## 2014-06-22 LAB — COMPREHENSIVE METABOLIC PANEL
ALK PHOS: 59 U/L (ref 39–117)
ALT: 15 U/L (ref 0–53)
AST: 28 U/L (ref 0–37)
Albumin: 4.8 g/dL (ref 3.5–5.2)
BILIRUBIN TOTAL: 0.9 mg/dL (ref 0.2–1.2)
BUN: 17 mg/dL (ref 6–23)
CALCIUM: 9.6 mg/dL (ref 8.4–10.5)
CHLORIDE: 103 meq/L (ref 96–112)
CO2: 26 mEq/L (ref 19–32)
CREATININE: 1.02 mg/dL (ref 0.50–1.35)
Glucose, Bld: 82 mg/dL (ref 70–99)
Potassium: 4.5 mEq/L (ref 3.5–5.3)
Sodium: 137 mEq/L (ref 135–145)
Total Protein: 7 g/dL (ref 6.0–8.3)

## 2014-06-22 LAB — POCT UA - MICROSCOPIC ONLY
BACTERIA, U MICROSCOPIC: NEGATIVE
CASTS, UR, LPF, POC: NEGATIVE
Crystals, Ur, HPF, POC: NEGATIVE
MUCUS UA: NEGATIVE
RBC, urine, microscopic: NEGATIVE
Yeast, UA: NEGATIVE

## 2014-06-22 LAB — POCT URINALYSIS DIPSTICK
BILIRUBIN UA: NEGATIVE
Blood, UA: NEGATIVE
Glucose, UA: NEGATIVE
KETONES UA: NEGATIVE
LEUKOCYTES UA: NEGATIVE
Nitrite, UA: NEGATIVE
PH UA: 6
SPEC GRAV UA: 1.02
Urobilinogen, UA: 0.2
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
## 2014-06-22 NOTE — Patient Instructions (Signed)

## 2014-06-23 LAB — HIV ANTIBODY (ROUTINE TESTING W REFLEX): HIV 1&2 Ab, 4th Generation: NONREACTIVE

## 2014-06-23 LAB — HSV(HERPES SIMPLEX VRS) I + II AB-IGG
HSV 1 Glycoprotein G Ab, IgG: 8.07 IV — ABNORMAL HIGH
HSV 2 Glycoprotein G Ab, IgG: 0.13 IV

## 2014-06-23 LAB — GC/CHLAMYDIA PROBE AMP
CT PROBE, AMP APTIMA: NEGATIVE
GC PROBE AMP APTIMA: NEGATIVE

## 2014-06-23 LAB — RPR

## 2014-08-19 ENCOUNTER — Emergency Department (HOSPITAL_COMMUNITY)
Admission: EM | Admit: 2014-08-19 | Discharge: 2014-08-19 | Disposition: A | Discharge status: Home / Self Care | Payer: 59 | Attending: Emergency Medicine | Admitting: Emergency Medicine

## 2014-08-19 ENCOUNTER — Encounter (HOSPITAL_COMMUNITY): Payer: Self-pay | Admitting: Emergency Medicine

## 2014-08-19 ENCOUNTER — Emergency Department (HOSPITAL_COMMUNITY): Payer: 59

## 2014-08-19 DIAGNOSIS — Z87891 Personal history of nicotine dependence: Secondary | ICD-10-CM | POA: Diagnosis not present

## 2014-08-19 DIAGNOSIS — R519 Headache, unspecified: Secondary | ICD-10-CM

## 2014-08-19 DIAGNOSIS — R51 Headache: Secondary | ICD-10-CM | POA: Diagnosis present

## 2014-08-19 DIAGNOSIS — Z87828 Personal history of other (healed) physical injury and trauma: Secondary | ICD-10-CM | POA: Insufficient documentation

## 2014-08-19 DIAGNOSIS — J0100 Acute maxillary sinusitis, unspecified: Secondary | ICD-10-CM

## 2014-08-19 HISTORY — DX: Accidental discharge from unspecified firearms or gun, initial encounter: W34.00XA

## 2014-08-19 MED ORDER — METOCLOPRAMIDE HCL 5 MG/ML IJ SOLN
10.0000 mg | Freq: Once | INTRAMUSCULAR | Status: AC
  Administered 2014-08-19: 10 mg via INTRAVENOUS
  Filled 2014-08-19: qty 2

## 2014-08-19 MED ORDER — DEXAMETHASONE SODIUM PHOSPHATE 10 MG/ML IJ SOLN
10.0000 mg | Freq: Once | INTRAMUSCULAR | Status: AC
  Administered 2014-08-19: 10 mg via INTRAVENOUS
  Filled 2014-08-19: qty 1

## 2014-08-19 MED ORDER — DIPHENHYDRAMINE HCL 50 MG/ML IJ SOLN
25.0000 mg | Freq: Once | INTRAMUSCULAR | Status: AC
  Administered 2014-08-19: 25 mg via INTRAVENOUS
  Filled 2014-08-19: qty 1

## 2014-08-19 MED ORDER — AMOXICILLIN-POT CLAVULANATE 875-125 MG PO TABS: 1.0000 | ORAL_TABLET | Freq: Two times a day (BID) | ORAL | Status: DC

## 2014-08-19 MED ORDER — SODIUM CHLORIDE 0.9 % IV BOLUS (SEPSIS)
1000.0000 mL | Freq: Once | INTRAVENOUS | Status: AC
  Administered 2014-08-19: 1000 mL via INTRAVENOUS

## 2014-08-19 NOTE — Discharge Instructions (Signed)
Headaches, Frequently Asked Questions °MIGRAINE HEADACHES °Q: What is migraine? What causes it? How can I treat it? °A: Generally, migraine headaches begin as a dull ache. Then they develop into a constant, throbbing, and pulsating pain. You may experience pain at the temples. You may experience pain at the front or back of one or both sides of the head. The pain is usually accompanied by a combination of: °· Nausea. °· Vomiting. °· Sensitivity to light and noise. °Some people (about 15%) experience an aura (see below) before an attack. The cause of migraine is believed to be chemical reactions in the brain. Treatment for migraine may include over-the-counter or prescription medications. It may also include self-help techniques. These include relaxation training and biofeedback.  °Q: What is an aura? °A: About 15% of people with migraine get an "aura". This is a sign of neurological symptoms that occur before a migraine headache. You may see wavy or jagged lines, dots, or flashing lights. You might experience tunnel vision or blind spots in one or both eyes. The aura can include visual or auditory hallucinations (something imagined). It may include disruptions in smell (such as strange odors), taste or touch. Other symptoms include: °· Numbness. °· A "pins and needles" sensation. °· Difficulty in recalling or speaking the correct word. °These neurological events may last as long as 60 minutes. These symptoms will fade as the headache begins. °Q: What is a trigger? °A: Certain physical or environmental factors can lead to or "trigger" a migraine. These include: °· Foods. °· Hormonal changes. °· Weather. °· Stress. °It is important to remember that triggers are different for everyone. To help prevent migraine attacks, you need to figure out which triggers affect you. Keep a headache diary. This is a good way to track triggers. The diary will help you talk to your healthcare professional about your condition. °Q: Does  weather affect migraines? °A: Bright sunshine, hot, humid conditions, and drastic changes in barometric pressure may lead to, or "trigger," a migraine attack in some people. But studies have shown that weather does not act as a trigger for everyone with migraines. °Q: What is the link between migraine and hormones? °A: Hormones start and regulate many of your body's functions. Hormones keep your body in balance within a constantly changing environment. The levels of hormones in your body are unbalanced at times. Examples are during menstruation, pregnancy, or menopause. That can lead to a migraine attack. In fact, about three quarters of all women with migraine report that their attacks are related to the menstrual cycle.  °Q: Is there an increased risk of stroke for migraine sufferers? °A: The likelihood of a migraine attack causing a stroke is very remote. That is not to say that migraine sufferers cannot have a stroke associated with their migraines. In persons under age 40, the most common associated factor for stroke is migraine headache. But over the course of a person's normal life span, the occurrence of migraine headache may actually be associated with a reduced risk of dying from cerebrovascular disease due to stroke.  °Q: What are acute medications for migraine? °A: Acute medications are used to treat the pain of the headache after it has started. Examples over-the-counter medications, NSAIDs, ergots, and triptans.  °Q: What are the triptans? °A: Triptans are the newest class of abortive medications. They are specifically targeted to treat migraine. Triptans are vasoconstrictors. They moderate some chemical reactions in the brain. The triptans work on receptors in your brain. Triptans help   to restore the balance of a neurotransmitter called serotonin. Fluctuations in levels of serotonin are thought to be a main cause of migraine.  °Q: Are over-the-counter medications for migraine effective? °A:  Over-the-counter, or "OTC," medications may be effective in relieving mild to moderate pain and associated symptoms of migraine. But you should see your caregiver before beginning any treatment regimen for migraine.  °Q: What are preventive medications for migraine? °A: Preventive medications for migraine are sometimes referred to as "prophylactic" treatments. They are used to reduce the frequency, severity, and length of migraine attacks. Examples of preventive medications include antiepileptic medications, antidepressants, beta-blockers, calcium channel blockers, and NSAIDs (nonsteroidal anti-inflammatory drugs). °Q: Why are anticonvulsants used to treat migraine? °A: During the past few years, there has been an increased interest in antiepileptic drugs for the prevention of migraine. They are sometimes referred to as "anticonvulsants". Both epilepsy and migraine may be caused by similar reactions in the brain.  °Q: Why are antidepressants used to treat migraine? °A: Antidepressants are typically used to treat people with depression. They may reduce migraine frequency by regulating chemical levels, such as serotonin, in the brain.  °Q: What alternative therapies are used to treat migraine? °A: The term "alternative therapies" is often used to describe treatments considered outside the scope of conventional Western medicine. Examples of alternative therapy include acupuncture, acupressure, and yoga. Another common alternative treatment is herbal therapy. Some herbs are believed to relieve headache pain. Always discuss alternative therapies with your caregiver before proceeding. Some herbal products contain arsenic and other toxins. °TENSION HEADACHES °Q: What is a tension-type headache? What causes it? How can I treat it? °A: Tension-type headaches occur randomly. They are often the result of temporary stress, anxiety, fatigue, or anger. Symptoms include soreness in your temples, a tightening band-like sensation  around your head (a "vice-like" ache). Symptoms can also include a pulling feeling, pressure sensations, and contracting head and neck muscles. The headache begins in your forehead, temples, or the back of your head and neck. Treatment for tension-type headache may include over-the-counter or prescription medications. Treatment may also include self-help techniques such as relaxation training and biofeedback. °CLUSTER HEADACHES °Q: What is a cluster headache? What causes it? How can I treat it? °A: Cluster headache gets its name because the attacks come in groups. The pain arrives with little, if any, warning. It is usually on one side of the head. A tearing or bloodshot eye and a runny nose on the same side of the headache may also accompany the pain. Cluster headaches are believed to be caused by chemical reactions in the brain. They have been described as the most severe and intense of any headache type. Treatment for cluster headache includes prescription medication and oxygen. °SINUS HEADACHES °Q: What is a sinus headache? What causes it? How can I treat it? °A: When a cavity in the bones of the face and skull (a sinus) becomes inflamed, the inflammation will cause localized pain. This condition is usually the result of an allergic reaction, a tumor, or an infection. If your headache is caused by a sinus blockage, such as an infection, you will probably have a fever. An x-ray will confirm a sinus blockage. Your caregiver's treatment might include antibiotics for the infection, as well as antihistamines or decongestants.  °REBOUND HEADACHES °Q: What is a rebound headache? What causes it? How can I treat it? °A: A pattern of taking acute headache medications too often can lead to a condition known as "rebound headache."   A pattern of taking too much headache medication includes taking it more than 2 days per week or in excessive amounts. That means more than the label or a caregiver advises. With rebound  headaches, your medications not only stop relieving pain, they actually begin to cause headaches. Doctors treat rebound headache by tapering the medication that is being overused. Sometimes your caregiver will gradually substitute a different type of treatment or medication. Stopping may be a challenge. Regularly overusing a medication increases the potential for serious side effects. Consult a caregiver if you regularly use headache medications more than 2 days per week or more than the label advises. °ADDITIONAL QUESTIONS AND ANSWERS °Q: What is biofeedback? °A: Biofeedback is a self-help treatment. Biofeedback uses special equipment to monitor your body's involuntary physical responses. Biofeedback monitors: °· Breathing. °· Pulse. °· Heart rate. °· Temperature. °· Muscle tension. °· Brain activity. °Biofeedback helps you refine and perfect your relaxation exercises. You learn to control the physical responses that are related to stress. Once the technique has been mastered, you do not need the equipment any more. °Q: Are headaches hereditary? °A: Four out of five (80%) of people that suffer report a family history of migraine. Scientists are not sure if this is genetic or a family predisposition. Despite the uncertainty, a child has a 50% chance of having migraine if one parent suffers. The child has a 75% chance if both parents suffer.  °Q: Can children get headaches? °A: By the time they reach high school, most young people have experienced some type of headache. Many safe and effective approaches or medications can prevent a headache from occurring or stop it after it has begun.  °Q: What type of doctor should I see to diagnose and treat my headache? °A: Start with your primary caregiver. Discuss his or her experience and approach to headaches. Discuss methods of classification, diagnosis, and treatment. Your caregiver may decide to recommend you to a headache specialist, depending upon your symptoms or other  physical conditions. Having diabetes, allergies, etc., may require a more comprehensive and inclusive approach to your headache. The National Headache Foundation will provide, upon request, a list of NHF physician members in your state. °Document Released: 12/30/2003 Document Revised: 01/01/2012 Document Reviewed: 06/08/2008 °ExitCare® Patient Information ©2015 [employer]. This information is not intended to replace advice given to you by your health care provider. Make sure you discuss any questions you have with your health care provider. ° °Sinusitis °Sinusitis is redness, soreness, and inflammation of the paranasal sinuses. Paranasal sinuses are air pockets within the bones of your face (beneath the eyes, the middle of the forehead, or above the eyes). In healthy paranasal sinuses, mucus is able to drain out, and air is able to circulate through them by way of your nose. However, when your paranasal sinuses are inflamed, mucus and air can become trapped. This can allow bacteria and other germs to grow and cause infection. °Sinusitis can develop quickly and last only a short time (acute) or continue over a long period (chronic). Sinusitis that lasts for more than 12 weeks is considered chronic.  °CAUSES  °Causes of sinusitis include: °· Allergies. °· Structural abnormalities, such as displacement of the cartilage that separates your nostrils (deviated septum), which can decrease the air flow through your nose and sinuses and affect sinus drainage. °· Functional abnormalities, such as when the small hairs (cilia) that line your sinuses and help remove mucus do not work properly or are not present. °SIGNS AND   SYMPTOMS  °Symptoms of acute and chronic sinusitis are the same. The primary symptoms are pain and pressure around the affected sinuses. Other symptoms include: °· Upper toothache. °· Earache. °· Headache. °· Bad breath. °· Decreased sense of smell and taste. °· A cough, which worsens when you are lying  flat. °· Fatigue. °· Fever. °· Thick drainage from your nose, which often is green and may contain pus (purulent). °· Swelling and warmth over the affected sinuses. °DIAGNOSIS  °Your health care provider will perform a physical exam. During the exam, your health care provider may: °· Look in your nose for signs of abnormal growths in your nostrils (nasal polyps). °· Tap over the affected sinus to check for signs of infection. °· View the inside of your sinuses (endoscopy) using an imaging device that has a light attached (endoscope). °If your health care provider suspects that you have chronic sinusitis, one or more of the following tests may be recommended: °· Allergy tests. °· Nasal culture. A sample of mucus is taken from your nose, sent to a lab, and screened for bacteria. °· Nasal cytology. A sample of mucus is taken from your nose and examined by your health care provider to determine if your sinusitis is related to an allergy. °TREATMENT  °Most cases of acute sinusitis are related to a viral infection and will resolve on their own within 10 days. Sometimes medicines are prescribed to help relieve symptoms (pain medicine, decongestants, nasal steroid sprays, or saline sprays).  °However, for sinusitis related to a bacterial infection, your health care provider will prescribe antibiotic medicines. These are medicines that will help kill the bacteria causing the infection.  °Rarely, sinusitis is caused by a fungal infection. In theses cases, your health care provider will prescribe antifungal medicine. °For some cases of chronic sinusitis, surgery is needed. Generally, these are cases in which sinusitis recurs more than 3 times per year, despite other treatments. °HOME CARE INSTRUCTIONS  °· Drink plenty of water. Water helps thin the mucus so your sinuses can drain more easily. °· Use a humidifier. °· Inhale steam 3 to 4 times a day (for example, sit in the bathroom with the shower running). °· Apply a warm,  moist washcloth to your face 3 to 4 times a day, or as directed by your health care provider. °· Use saline nasal sprays to help moisten and clean your sinuses. °· Take medicines only as directed by your health care provider. °· If you were prescribed either an antibiotic or antifungal medicine, finish it all even if you start to feel better. °SEEK IMMEDIATE MEDICAL CARE IF: °· You have increasing pain or severe headaches. °· You have nausea, vomiting, or drowsiness. °· You have swelling around your face. °· You have vision problems. °· You have a stiff neck. °· You have difficulty breathing. °MAKE SURE YOU:  °· Understand these instructions. °· Will watch your condition. °· Will get help right away if you are not doing well or get worse. °Document Released: 10/09/2005 Document Revised: 02/23/2014 Document Reviewed: 10/24/2011 °ExitCare® Patient Information ©2015 [employer]. This information is not intended to replace advice given to you by your health care provider. Make sure you discuss any questions you have with your health care provider. ° °Emergency Department Resource Guide °1) Find a Doctor and Pay Out of Pocket °Although you won't have to find out who is covered by your insurance plan, it is a good idea to ask around and get recommendations. You will then   need to call the office and see if the doctor you have chosen will accept you as a new patient and what types of options they offer for patients who are self-pay. Some doctors offer discounts or will set up payment plans for their patients who do not have insurance, but you will need to ask so you aren't surprised when you get to your appointment. ° °2) Contact Your Local Health Department °Not all health departments have doctors that can see patients for sick visits, but many do, so it is worth a call to see if yours does. If you don't know where your local health department is, you can check in your phone book. The CDC also has a tool to help you  locate your state's health department, and many state websites also have listings of all of their local health departments. ° °3) Find a Walk-in Clinic °If your illness is not likely to be very severe or complicated, you may want to try a walk in clinic. These are popping up all over the country in pharmacies, drugstores, and shopping centers. They're usually staffed by nurse practitioners or physician assistants that have been trained to treat common illnesses and complaints. They're usually fairly quick and inexpensive. However, if you have serious medical issues or chronic medical problems, these are probably not your best option. ° °No Primary Care Doctor: °- Call [facility] at  832-8000 - they can help you locate a primary care doctor that  accepts your insurance, provides certain services, etc. °- Physician Referral Service- 1-800-533-3463 ° °Chronic Pain Problems: °Organization         Address  Phone   Notes  °[facility]  ([phone]) [phone] Patients need to be referred by their primary care doctor.  ° °Medication Assistance: °Organization         Address  Phone   Notes  °[place] [facility] [address] °[place], [place] [address] [phone]) [phone] --Must be a resident of [place] °-- Must have NO insurance coverage whatsoever (no Medicaid/ Medicare, etc.) °-- The pt. MUST have a primary care doctor that directs their care regularly and follows them in the community °  °[facility]  (866) 331-1348   °[employer]  (888) 892-1162   ° °Agencies that provide inexpensive medical care: °Organization         Address  Phone   Notes  °[facility]  ([phone]) [phone]   °[facility]    ([phone]) 832-7272   °[facility] [address] °[place], [place] [address] ([phone]) [phone]   °[facility] [address], °[place] ([phone]) [phone]   °[facility]    (336) 373-0678   °[facility]    (336) 272-1050   °[facility] ° [address], [place] Phone:  (336) [phone], Fax:  ([phone]) [phone] Hours of Operation:  9 am - 6 pm, M-F.  Also accepts Medicaid/Medicare and self-pay.  °[facility] ° [address], [place] Phone: ([phone]) [phone], Fax: ([phone]) [phone]. Hours of Operation:  8:30 am - 5:30 pm, M-F.  Also accepts Medicaid and self-pay.  °[facility] [place] [address], [place] Phone: ([phone]) [phone]   °[facility] [address], [place], [place] (336)[phone], Ext. 123 Mondays & Thursdays: 7-9 AM.  First 15 patients are seen on a first come, first serve basis. °  ° °  Medicaid-accepting [place] Providers: ° °Organization         Address  Phone   Notes  °[facility] [address], [place] ([phone]) [phone] Also accepts self-pay patients.  °[facility] [address], [place] ° ([phone]) [phone]   °[facility] [address], [place] ([phone]) [phone]   °[facility] [address] [place] [address], [place] ([phone]) [phone]   °[facility] [address], [place]  ° ([phone]) [phone] Only accepts [place] Access Medicaid patients after they have their name applied to their card.  ° °Self-Pay (no insurance) in [place]: ° °Organization         Address  Phone   Notes  °Sickle Cell Patients, [facility] [address], [place] ([phone]) [phone]   °[facility] [address], [place] ([phone]) [phone]   °[facility] ° [address], [place] ([phone]) [phone]   °[facility]/Dr. Osei-Bonsu ° [address] [place] [address], [place] or [address], [place] ([phone]) [phone] Phone number for both [place] and [place] locations is the same.  °[facility] [address],  [place] ([phone]) [phone]   °[facility] [address] [place] [address], [place] or [address] ([phone]) [phone] °(336) [phone]   °[facility] [address], [place] (336) [phone], phone; ([phone]) [phone], fax Sees patients 1st and 3rd Saturday of every month.  Must not qualify for public or private insurance (i.e. Medicaid, Medicare, [place] Health Choice, Veterans' Benefits) • Household income should be no more than 200% of the poverty level •The clinic cannot treat you if you are pregnant or think you are pregnant • Sexually transmitted diseases are not treated at the clinic.  ° ° °Dental Care: °Organization         Address  Phone  Notes  °[facility] [address], [place] ([phone]) [phone] Accepts children up to age 21 who are enrolled in Medicaid or [place] Health Choice; pregnant women with a Medicaid card; and children who have applied for Medicaid or [place] Health Choice, but were declined, whose parents can pay a reduced fee at time of service.  °[facility] [place]  [address], [place] (336) [phone] Accepts children up to age 21 who are enrolled in Medicaid or [place] Health Choice; pregnant women with a Medicaid card; and children who have applied for Medicaid or [place] Health Choice, but were declined, whose parents can pay a reduced fee at time of service.  °[facility] ° [address], [place] ([phone]) [phone] Patients are seen by appointment only. Walk-ins are not accepted. [facility] will see patients 18 years of age and older. °Monday - Tuesday (8am-5pm) °Most Wednesdays (8:30-5pm) °$30 per visit, cash only  °[facility] ° [address], [place] ([phone]) [phone] Patients are seen by appointment only. Walk-ins are not accepted. [facility] will see patients 18 years of age and older. °One Wednesday Evening  (Monthly: Volunteer Based).  $30 per visit, cash only  °[facility]  ([phone]) [phone] for adults; Children under age 4, call Graduate Pediatric Dentistry at ([phone]) [phone]. Children aged 4-14, please call ([phone]) [phone] to request a   pediatric application. ° Dental services are provided in all areas of dental care including fillings, crowns and bridges, complete and partial dentures, implants, gum treatment, root canals, and extractions. Preventive care is also provided. Treatment is provided to both adults and children. °Patients are selected via a lottery and there is often a waiting list. °  °[facility] [address], °[place] ° (336) 763-8833 [email or web address] °  °[facility] [address], [place], [place] (336)723-1848, Ext. 123 Second and Fourth Thursday of each month, opens at 6:30 AM; Clinic ends at 9 AM.  Patients are seen on a first-come first-served basis, and a limited number are seen during each clinic.  ° °[facility] ° [address], [place], [place] ([phone]) [phone]   Eligibility Requirements °You must have lived in [place], [place], or [place] counties for at least the last three months. °  You cannot be eligible for state or federal sponsored healthcare insurance, including Veterans Administration, Medicaid, or Medicare. °  You generally cannot be eligible for healthcare insurance through your employer.  °  How to apply: °Eligibility screenings are held every Tuesday and Wednesday afternoon from 1:00 pm until 4:00 pm. You do not need an appointment for the interview!  °[facility] [address], [place], [place] [phone]   °[facility]  336-342-8273   °[facility]  336-703-3100   °[facility]  336-570-6415   ° °Behavioral Health Resources in the Community: °Intensive Outpatient Programs °Organization         Address  Phone  Notes  °[place]  [facility] [address], [place], [place] 336-878-6098   °[facility] [address], [place], [place] [phone]   °[facility] [address], [place], [place] ° 336-882-2125   °[facility] [address],  °[place], [place] 1-800-853-5163 or 336-641-4981   °Substance Abuse Resources °Organization         Address  Phone  Notes  °[facility]  336-882-2125   °[facility]  336-784-9470   °[facility]  [phone]   °[facility]  336-845-3988   °Residential & Outpatient Substance Abuse Program  1-800-659-3381   °Psychological Services °Organization         Address  Phone  Notes  °[facility]  336- [phone]   °[facility]  336- 378-7881   °[facility] [address], [place] 1-800-853-5163 or 336-641-4981   ° °Mobile Crisis Teams °Organization         Address  Phone  Notes  °[facility], Mobile Crisis Care Unit  1-877-626-1772   °Assertive °[facility] ° [address] [place], [place] 336-834-9664   °Sharon DeEsch [address] °[place] 336-554-5454   ° °Self-Help/Support Groups °Organization         Address  Phone             Notes  °[facility] - variety of support groups  336- 373-1402 Call for more information  °Narcotics Anonymous (NA), [facility] [address], °[place]  2 meetings at this location  ° °Residential Treatment Programs °Organization         Address  Phone  Notes  °[facility] [address],    ° [place]  1-866-801-8205   °[facility] ° [address],   [place], [place] [phone]   °[facility] [address], [place] 336-845-3988 Admissions: 8am-3pm M-F  °[facility] [address]    °[place], [place] 336-841-1104   °[facility] [address],  [place], [place] 336-379-7146   °[facility] [address]  °[place], [place] 336-285-9073   °[facility] - Intensive Outpatient [address], , [place] 336-852-3033   °[facility] [address]  °[place], [place] 1-877-615-2722 or 336-784-9470   °[facility] [address] [place], [place] 336-227-7417 Accepts Medicaid  °[facility] [address]  ° [place] 1-800-659-3381 Substance Abuse/Addiction Treatment  ° °[facility] °Organization         Address  Phone  Notes  °[facility]  (888) 581-9988   °Julie Brannon, PhD [address] [place], [place]   (336) 349-5553 or (336) 951-0000   °[facility]   [address] °[place], [place] (336) 349-4454   °[facility] [address], [place], [place] (336) 342-8316 Insurance/Medicaid/sponsorship through [facility]  °[facility] [address]                                    [place], [place] (336) 342-8316 Therapy/tele-psych/case  °[facility] [address]  ° [place], [place] (336) 349-2233    °Dr. Arfeen  (336) 349-4544   °[facility]  [employer] [facility] 1) [address], [place] °2) [address], [place] °3)  [address], [place] (336) 349-3220 °(336) 342-7768 ° °(336) 342-8140   °[place] [facility] (336) 342-1394 or (336) 342-3537 (After Hours)    ° ° °
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
## 2014-08-19 NOTE — ED Notes (Signed)
Pt states he has had a migraine off and on for the past 6 to 7 days  Pt states he has been using OTC medication that helps but then it comes right back  Pt states he has had blurred vision and is sensitive to light  Denies N/V

## 2014-10-12 ENCOUNTER — Ambulatory Visit (INDEPENDENT_AMBULATORY_CARE_PROVIDER_SITE_OTHER): Payer: 59 | Admitting: Family Medicine

## 2014-10-12 VITALS — BP 122/78 | HR 56 | Temp 98.7°F | Resp 18 | Ht 70.0 in | Wt 202.6 lb

## 2014-10-12 DIAGNOSIS — R300 Dysuria: Secondary | ICD-10-CM

## 2014-10-12 DIAGNOSIS — N342 Other urethritis: Secondary | ICD-10-CM

## 2014-10-12 DIAGNOSIS — Z7251 High risk heterosexual behavior: Secondary | ICD-10-CM

## 2014-10-12 LAB — POCT UA - MICROSCOPIC ONLY
Bacteria, U Microscopic: NEGATIVE
Casts, Ur, LPF, POC: NEGATIVE
Crystals, Ur, HPF, POC: NEGATIVE
Epithelial cells, urine per micros: NEGATIVE
Mucus, UA: NEGATIVE
RBC, urine, microscopic: NEGATIVE
WBC, UR, HPF, POC: NEGATIVE
YEAST UA: NEGATIVE

## 2014-10-12 LAB — POCT URINALYSIS DIPSTICK
BILIRUBIN UA: NEGATIVE
GLUCOSE UA: NEGATIVE
Ketones, UA: NEGATIVE
Leukocytes, UA: NEGATIVE
NITRITE UA: NEGATIVE
Protein, UA: NEGATIVE
RBC UA: NEGATIVE
SPEC GRAV UA: 1.025
Urobilinogen, UA: 0.2
pH, UA: 6

## 2014-10-12 MED ORDER — AZITHROMYCIN 250 MG PO TABS: 1000.0000 mg | ORAL_TABLET | Freq: Once | ORAL | Status: DC

## 2014-10-12 MED ORDER — CEFTRIAXONE SODIUM 1 G IJ SOLR
250.0000 mg | Freq: Once | INTRAMUSCULAR | Status: AC
  Administered 2014-10-12: 250 mg via INTRAMUSCULAR
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
## 2014-10-12 NOTE — Patient Instructions (Addendum)
Condoms every time with sexual activity.  You should receive a call or letter about your lab results within the next week to 10 days.  Return to the clinic or go to the nearest emergency room if any of your symptoms worsen or new symptoms occur.   Urethritis Urethritis is an inflammation of the tube through which urine exits your bladder (urethra).  CAUSES Urethritis is often caused by an infection in your urethra. The infection can be viral, like herpes. The infection can also be bacterial, like gonorrhea. RISK FACTORS Risk factors of urethritis include:  Having sex without using a condom.  Having multiple sexual partners.  Having poor hygiene. SIGNS AND SYMPTOMS Symptoms of urethritis are less noticeable in women than in men. These symptoms include:  Burning feeling when you urinate (dysuria).  Discharge from your urethra.  Blood in your urine (hematuria).  Urinating more than usual. DIAGNOSIS  To confirm a diagnosis of urethritis, your health care provider will do the following:  Ask about your sexual history.  Perform a physical exam.  Have you provide a sample of your urine for lab testing.  Use a cotton swab to gently collect a sample from your urethra for lab testing. TREATMENT  It is important to treat urethritis. Depending on the cause, untreated urethritis may lead to serious genital infections and possibly infertility. Urethritis caused by a bacterial infection is treated with antibiotic medicine. All sexual partners must be treated.  HOME CARE INSTRUCTIONS  Do not have sex until the test results are known and treatment is completed, even if your symptoms go away before you finish treatment.  If you were prescribed an antibiotic, finish it all even if you start to feel better. SEEK MEDICAL CARE IF:   Your symptoms are not improved in 3 days.  Your symptoms are getting worse.  You develop abdominal pain or pelvic pain (in women).  You develop joint  pain.  You have a fever. SEEK IMMEDIATE MEDICAL CARE IF:   You have severe pain in the belly, back, or side.  You have repeated vomiting. MAKE SURE YOU:  Understand these instructions.  Will watch your condition.  Will get help right away if you are not doing well or get worse. Document Released: 04/04/2001 Document Revised: 02/23/2014 Document Reviewed: 06/09/2013 [facility] OrtizExitCare Patient Information 2015 [place], [place]. This information is not intended to replace advice given to you by your health care provider. Make sure you discuss any questions you have with your health care provider.

## 2014-10-13 LAB — RPR

## 2014-10-13 LAB — HEPATITIS B SURFACE ANTIGEN: Hepatitis B Surface Ag: NEGATIVE

## 2014-10-13 LAB — HEPATITIS C ANTIBODY: HCV Ab: NEGATIVE

## 2014-10-13 LAB — HEPATITIS B SURFACE ANTIBODY, QUANTITATIVE: Hepatitis B-Post: 1000 m[IU]/mL

## 2014-10-13 LAB — HIV ANTIBODY (ROUTINE TESTING W REFLEX): HIV: NONREACTIVE

## 2014-10-13 LAB — HSV(HERPES SIMPLEX VRS) I + II AB-IGG
HSV 1 GLYCOPROTEIN G AB, IGG: 10.96 IV — AB
HSV 2 Glycoprotein G Ab, IgG: <0.1 IV

## 2014-10-19 ENCOUNTER — Telehealth: Payer: Self-pay | Admitting: Radiology

## 2014-10-19 NOTE — Telephone Encounter (Signed)
Pt calling about lab results. Please review. 

## 2014-11-02 ENCOUNTER — Ambulatory Visit (INDEPENDENT_AMBULATORY_CARE_PROVIDER_SITE_OTHER): Payer: 59 | Admitting: Family Medicine

## 2014-11-02 VITALS — BP 118/69 | HR 73 | Temp 98.2°F | Resp 17 | Ht 68.5 in | Wt 197.0 lb

## 2014-11-02 DIAGNOSIS — L739 Follicular disorder, unspecified: Secondary | ICD-10-CM

## 2014-11-02 DIAGNOSIS — R21 Rash and other nonspecific skin eruption: Secondary | ICD-10-CM

## 2014-11-02 MED ORDER — MUPIROCIN 2 % EX OINT: TOPICAL_OINTMENT | CUTANEOUS | Status: DC

## 2014-11-02 NOTE — Patient Instructions (Signed)
Folliculitis  Folliculitis is inflammation of the hair follicle gland in the skin, out of which hair grows. It is often caused by a bacterial infection. Folliculitis may occur on any skin surface of the body, but the bacteria that cause folliculitis survive best in warm, moist environments. For this reason, folliculitis is most common on the back.  SYMPTOMS   · Red bumps on skin (papules).  · White bumps on skin (pustules).  · Itchy rash.  · Painful rash.  · Fever.  · Swollen lymph glands.  CAUSES   · Poor hygiene.  · Excessive or prolonged sweating.  · Irritation from clothing or athletic gear.  · Tendency toward a type of bacteria (Staphylococcus) infections.  RISK INCREASES WITH:  · Not using antibacterial soap.  · Remaining in sweaty or damp clothing.  · Poor immunity.  PREVENTION   · Remove sweaty clothing as soon as possible after activity.  · Bathe or shower as soon as possible after a workout.  · Use antibacterial soap.  · Pay special attention to your back when cleansing.  TREATMENT   Bacterial folliculitis responds well to oral antibiotics and antibiotics applied to the skin. Treatment first involves cleansing the affected area with antibacterial soap. Warm compression coverings to the area can help large follicles to drain. After washing, applying dressings that contain drying agents will help diminish bacteria in follicles, and help the rash go away more quickly. If the condition does not seem to go away with changes in hygiene alone, antibiotic creams and ointments can be applied. For more serious cases, when the infection extends into the soft tissues (cellulitis), oral antibiotics can be given. Finally, on rare occasions, incision and drainage of the follicles may be needed for the infection to be cured.   Document Released: 10/09/2005 Document Revised: 01/01/2012 Document Reviewed: 01/21/2009  ExitCare® Patient Information ©2015 Exit[employer], LLC. This information is not intended to replace advice given  to you by your health care provider. Make sure you discuss any questions you have with your health care provider.
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
## 2018-01-07 ENCOUNTER — Other Ambulatory Visit: Payer: Self-pay

## 2018-01-07 ENCOUNTER — Emergency Department (HOSPITAL_COMMUNITY)
Admission: EM | Admit: 2018-01-07 | Discharge: 2018-01-07 | Disposition: A | Discharge status: Home / Self Care | Payer: Self-pay | Attending: Emergency Medicine | Admitting: Emergency Medicine

## 2018-01-07 ENCOUNTER — Encounter (HOSPITAL_COMMUNITY): Payer: Self-pay | Admitting: Emergency Medicine

## 2018-01-07 ENCOUNTER — Emergency Department (HOSPITAL_COMMUNITY): Payer: Self-pay

## 2018-01-07 DIAGNOSIS — F17210 Nicotine dependence, cigarettes, uncomplicated: Secondary | ICD-10-CM | POA: Insufficient documentation

## 2018-01-07 DIAGNOSIS — Z202 Contact with and (suspected) exposure to infections with a predominantly sexual mode of transmission: Secondary | ICD-10-CM | POA: Insufficient documentation

## 2018-01-07 DIAGNOSIS — Z711 Person with feared health complaint in whom no diagnosis is made: Secondary | ICD-10-CM

## 2018-01-07 LAB — URINALYSIS, ROUTINE W REFLEX MICROSCOPIC
Bilirubin Urine: NEGATIVE
Glucose, UA: NEGATIVE mg/dL
Hgb urine dipstick: NEGATIVE
Ketones, ur: NEGATIVE mg/dL
Leukocytes, UA: NEGATIVE
Nitrite: NEGATIVE
PH: 6 (ref 5.0–8.0)
Protein, ur: NEGATIVE mg/dL
Specific Gravity, Urine: 1.023 (ref 1.005–1.030)

## 2018-01-07 NOTE — ED Notes (Signed)
D/c reviewed with teach back 

## 2018-01-07 NOTE — ED Triage Notes (Signed)
Has a bump on his left testicle noticed it x 2 days ago but has had discomfort x 4 months  , no drainage  Denies dysuria or drainage

## 2018-01-07 NOTE — Discharge Instructions (Signed)
We will contact you with the results of your gonorrhea and chlamydia screen when available. Your ultrasound today showed no masses, abscesses or other concerning process.  It was a normal ultrasound. Your urinalysis did not show any signs of infection. [facility] for further evaluation. Return to ED for worsening symptoms, bloody urine, penile discharge, fevers or trouble with urination.
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
## 2018-05-14 ENCOUNTER — Encounter (HOSPITAL_COMMUNITY): Payer: Self-pay | Admitting: Emergency Medicine

## 2018-05-14 ENCOUNTER — Emergency Department (HOSPITAL_COMMUNITY): Payer: Self-pay

## 2018-05-14 ENCOUNTER — Inpatient Hospital Stay (HOSPITAL_COMMUNITY)
Admission: EM | Admit: 2018-05-14 | Discharge: 2018-05-16 | DRG: 390 | Disposition: A | Discharge status: Home / Self Care | Payer: Self-pay | Attending: General Surgery | Admitting: General Surgery

## 2018-05-14 ENCOUNTER — Other Ambulatory Visit: Payer: Self-pay

## 2018-05-14 ENCOUNTER — Inpatient Hospital Stay (HOSPITAL_COMMUNITY): Payer: Self-pay

## 2018-05-14 DIAGNOSIS — R1114 Bilious vomiting: Secondary | ICD-10-CM

## 2018-05-14 DIAGNOSIS — Z9049 Acquired absence of other specified parts of digestive tract: Secondary | ICD-10-CM

## 2018-05-14 DIAGNOSIS — Z0189 Encounter for other specified special examinations: Secondary | ICD-10-CM

## 2018-05-14 DIAGNOSIS — Z87828 Personal history of other (healed) physical injury and trauma: Secondary | ICD-10-CM

## 2018-05-14 DIAGNOSIS — F1290 Cannabis use, unspecified, uncomplicated: Secondary | ICD-10-CM | POA: Diagnosis present

## 2018-05-14 DIAGNOSIS — R1084 Generalized abdominal pain: Secondary | ICD-10-CM

## 2018-05-14 DIAGNOSIS — F17200 Nicotine dependence, unspecified, uncomplicated: Secondary | ICD-10-CM | POA: Diagnosis present

## 2018-05-14 DIAGNOSIS — K56609 Unspecified intestinal obstruction, unspecified as to partial versus complete obstruction: Principal | ICD-10-CM

## 2018-05-14 DIAGNOSIS — D72829 Elevated white blood cell count, unspecified: Secondary | ICD-10-CM | POA: Diagnosis present

## 2018-05-14 HISTORY — DX: Family history of other specified conditions: Z84.89

## 2018-05-14 HISTORY — DX: Unspecified intestinal obstruction, unspecified as to partial versus complete obstruction: K56.609

## 2018-05-14 HISTORY — DX: Personal history of other medical treatment: Z92.89

## 2018-05-14 LAB — COMPREHENSIVE METABOLIC PANEL
ALK PHOS: 64 U/L (ref 38–126)
ALT: 17 U/L (ref 0–44)
ANION GAP: 9 (ref 5–15)
AST: 27 U/L (ref 15–41)
Albumin: 4.7 g/dL (ref 3.5–5.0)
BILIRUBIN TOTAL: 1.2 mg/dL (ref 0.3–1.2)
BUN: 11 mg/dL (ref 6–20)
CALCIUM: 9.9 mg/dL (ref 8.9–10.3)
CO2: 24 mmol/L (ref 22–32)
Chloride: 106 mmol/L (ref 98–111)
Creatinine, Ser: 1.11 mg/dL (ref 0.61–1.24)
GFR calc Af Amer: >60 mL/min (ref >60)
GLUCOSE: 120 mg/dL — AB (ref 70–99)
Potassium: 4.2 mmol/L (ref 3.5–5.1)
Sodium: 139 mmol/L (ref 135–145)
TOTAL PROTEIN: 7.4 g/dL (ref 6.5–8.1)

## 2018-05-14 LAB — CBC
HCT: 49.4 % (ref 39.0–52.0)
Hemoglobin: 17.2 g/dL — ABNORMAL HIGH (ref 13.0–17.0)
MCH: 31.2 pg (ref 26.0–34.0)
MCHC: 34.8 g/dL (ref 30.0–36.0)
MCV: 89.5 fL (ref 78.0–100.0)
PLATELETS: 188 10*3/uL (ref 150–400)
RBC: 5.52 MIL/uL (ref 4.22–5.81)
RDW: 11.9 % (ref 11.5–15.5)
WBC: 12.3 10*3/uL — AB (ref 4.0–10.5)

## 2018-05-14 LAB — LIPASE, BLOOD: Lipase: 33 U/L (ref 11–51)

## 2018-05-14 MED ORDER — ONDANSETRON 4 MG PO TBDP: 4.0000 mg | ORAL_TABLET | Freq: Four times a day (QID) | ORAL | Status: DC | PRN

## 2018-05-14 MED ORDER — ONDANSETRON HCL 4 MG/2ML IJ SOLN
4.0000 mg | Freq: Once | INTRAMUSCULAR | Status: AC
  Administered 2018-05-14: 4 mg via INTRAVENOUS
  Filled 2018-05-14: qty 2

## 2018-05-14 MED ORDER — METOCLOPRAMIDE HCL 5 MG/ML IJ SOLN
10.0000 mg | Freq: Once | INTRAMUSCULAR | Status: AC
  Administered 2018-05-14: 10 mg via INTRAVENOUS
  Filled 2018-05-14: qty 2

## 2018-05-14 MED ORDER — DEXTROSE-NACL 5-0.9 % IV SOLN
INTRAVENOUS | Status: DC
  Administered 2018-05-14 – 2018-05-15 (×2): via INTRAVENOUS

## 2018-05-14 MED ORDER — KETOROLAC TROMETHAMINE 30 MG/ML IJ SOLN
30.0000 mg | Freq: Four times a day (QID) | INTRAMUSCULAR | Status: DC | PRN
  Administered 2018-05-14: 30 mg via INTRAVENOUS
  Filled 2018-05-14: qty 1

## 2018-05-14 MED ORDER — DIATRIZOATE MEGLUMINE & SODIUM 66-10 % PO SOLN
90.0000 mL | Freq: Once | ORAL | Status: AC
  Administered 2018-05-14: 90 mL via NASOGASTRIC
  Filled 2018-05-14 (×2): qty 90

## 2018-05-14 MED ORDER — ONDANSETRON HCL 4 MG/2ML IJ SOLN: 4.0000 mg | Freq: Four times a day (QID) | INTRAMUSCULAR | Status: DC | PRN

## 2018-05-14 MED ORDER — ONDANSETRON 4 MG PO TBDP
4.0000 mg | ORAL_TABLET | Freq: Once | ORAL | Status: AC | PRN
  Administered 2018-05-14: 4 mg via ORAL
  Filled 2018-05-14: qty 1

## 2018-05-14 MED ORDER — DIPHENHYDRAMINE HCL 50 MG/ML IJ SOLN
25.0000 mg | Freq: Once | INTRAMUSCULAR | Status: AC
  Administered 2018-05-14: 25 mg via INTRAVENOUS
  Filled 2018-05-14: qty 1

## 2018-05-14 MED ORDER — ENOXAPARIN SODIUM 40 MG/0.4ML ~~LOC~~ SOLN
40.0000 mg | SUBCUTANEOUS | Status: DC
  Administered 2018-05-14 – 2018-05-15 (×2): 40 mg via SUBCUTANEOUS
  Filled 2018-05-14 (×2): qty 0.4

## 2018-05-14 MED ORDER — IOHEXOL 300 MG/ML  SOLN
100.0000 mL | Freq: Once | INTRAMUSCULAR | Status: AC | PRN
  Administered 2018-05-14: 100 mL via INTRAVENOUS

## 2018-05-14 MED ORDER — SODIUM CHLORIDE 0.9 % IV BOLUS
1000.0000 mL | Freq: Once | INTRAVENOUS | Status: AC
  Administered 2018-05-14: 1000 mL via INTRAVENOUS

## 2018-05-14 NOTE — ED Notes (Signed)
Advanced NG tube an additional 10cm

## 2018-05-14 NOTE — ED Notes (Signed)
PT reports pain and nausea have improved. No requests at this time.

## 2018-05-14 NOTE — ED Notes (Signed)
Patient transported to CT 
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
## 2018-05-14 NOTE — Progress Notes (Signed)
Patient admitted from ED with SBO. Alert and oriented x4.Vital signs within normal limits. Pain 2/10. Oriented to room and call bell. Family at bedside.

## 2018-05-14 NOTE — ED Triage Notes (Signed)
Patient complains of abdominal pain and emesis since last night. Denies diarrhea. Patient alert, oriented, and in no apparent distress at this time.
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
## 2018-05-15 ENCOUNTER — Inpatient Hospital Stay (HOSPITAL_COMMUNITY): Payer: Self-pay

## 2018-05-15 LAB — HIV ANTIBODY (ROUTINE TESTING W REFLEX): HIV Screen 4th Generation wRfx: NONREACTIVE

## 2018-05-15 MED ORDER — ORAL CARE MOUTH RINSE
15.0000 mL | Freq: Two times a day (BID) | OROMUCOSAL | Status: DC
  Administered 2018-05-15: 15 mL via OROMUCOSAL

## 2018-05-15 NOTE — Plan of Care (Signed)
  Problem: Nutrition: Goal: Adequate nutrition will be maintained Outcome: Progressing   Problem: Pain Managment: Goal: General experience of comfort will improve Outcome: Progressing   

## 2018-05-15 NOTE — Plan of Care (Signed)
  Problem: Pain Managment: Goal: General experience of comfort will improve Outcome: Progressing   
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
## 2018-05-15 NOTE — Care Management Note (Signed)
Case Management Note  Patient Details  Name: Isaiah BlakesSteven M Dever MRN: [id number] Date of Birth: [birth year]  Subjective/Objective:                 Spoke to patient's mom at bedside. Verified he did not have insurance nor a PCP. [facility] 2[facility]30. Provided with MATCH. [facility].    Action/Plan:   Expected Discharge Date:                  Expected Discharge Plan:  Home/Self Care  In-House Referral:     Discharge planning Services  CM Consult, MATCH Program, [facility]  Post Acute Care Choice:    Choice offered to:     DME Arranged:    DME Agency:     HH Arranged:    HH Agency:     Status of Service:  In process, will continue to follow  If discussed at Long Length of Stay Meetings, dates discussed:    Additional Comments:  Lawerance SabalDebbie Claudell Rhody, RN 05/15/2018, 11:18 AM

## 2018-05-16 NOTE — Care Management Note (Signed)
Case Management Note  Patient Details  Name: Isaiah BlakesSteven M Fricker MRN: [id number] Date of Birth: 08-Oct-1991  Subjective/Objective:                    Action/Plan: Pt set up with [facility]. No new medications. CM signing off.   Expected Discharge Date:  05/16/18               Expected Discharge Plan:  Home/Self Care  In-House Referral:     Discharge planning Services  CM Consult, MATCH Program, [facility]  Post Acute Care Choice:    Choice offered to:     DME Arranged:    DME Agency:     HH Arranged:    HH Agency:     Status of Service:  Completed, signed off  If discussed at [employer] Length of [employer] Meetings, dates discussed:    Additional Comments:  Kermit BaloKelli F Ronell Boldin, RN 05/16/2018, 2:29 PM

## 2018-05-16 NOTE — Progress Notes (Signed)
Discharged home today accompanied by patient's mother.Discharged instructions, personal belongings given to patient. Verbalized understanding of instructions
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
## 2018-06-12 ENCOUNTER — Ambulatory Visit (INDEPENDENT_AMBULATORY_CARE_PROVIDER_SITE_OTHER): Payer: Self-pay | Admitting: Physician Assistant

## 2019-11-28 ENCOUNTER — Other Ambulatory Visit: Payer: Self-pay

## 2019-11-28 ENCOUNTER — Encounter (HOSPITAL_COMMUNITY): Payer: Self-pay

## 2019-11-28 ENCOUNTER — Ambulatory Visit (HOSPITAL_COMMUNITY)
Admission: EM | Admit: 2019-11-28 | Discharge: 2019-11-28 | Disposition: A | Discharge status: Home / Self Care | Payer: Self-pay | Attending: Family Medicine | Admitting: Family Medicine

## 2019-11-28 DIAGNOSIS — Z711 Person with feared health complaint in whom no diagnosis is made: Secondary | ICD-10-CM | POA: Insufficient documentation

## 2019-11-28 DIAGNOSIS — Z202 Contact with and (suspected) exposure to infections with a predominantly sexual mode of transmission: Secondary | ICD-10-CM | POA: Insufficient documentation

## 2019-11-28 DIAGNOSIS — R300 Dysuria: Secondary | ICD-10-CM | POA: Insufficient documentation

## 2019-11-28 MED ORDER — AZITHROMYCIN 250 MG PO TABS
ORAL_TABLET | ORAL | Status: AC
  Filled 2019-11-28: qty 4

## 2019-11-28 MED ORDER — ONDANSETRON 4 MG PO TBDP
4.0000 mg | ORAL_TABLET | Freq: Once | ORAL | Status: AC
  Administered 2019-11-28: 4 mg via ORAL

## 2019-11-28 MED ORDER — AZITHROMYCIN 250 MG PO TABS
1000.0000 mg | ORAL_TABLET | Freq: Once | ORAL | Status: AC
  Administered 2019-11-28: 1000 mg via ORAL

## 2019-11-28 MED ORDER — METRONIDAZOLE 500 MG PO TABS
2000.0000 mg | ORAL_TABLET | Freq: Once | ORAL | 0 refills | Status: AC
Start: 2019-11-28 — End: 2019-11-28

## 2019-11-28 MED ORDER — ONDANSETRON 4 MG PO TBDP
ORAL_TABLET | ORAL | Status: AC
  Filled 2019-11-28: qty 1

## 2019-11-28 NOTE — ED Triage Notes (Signed)
Patient presents to Urgent Care with complaints of being exposed to chlamydia and trichomonas since his girlfriend cheated last week. Patient reports he feels some tingling in his penis, no discharge.

## 2019-11-28 NOTE — ED Notes (Signed)
I called pt 3 times and did not answered.

## 2019-11-28 NOTE — Discharge Instructions (Addendum)
Because of the concern for exposure as well as symptoms we will treat you today for concern for chlamydia and trichomonas.  We have given you your chlamydia treatment here today, this afternoon may take the treatment for trichomonas. (this can cause upset stomach.  Please withhold from intercourse for the next week. Please use condoms to prevent STD's.   Will notify of any positive findings from your penile swab and if any changes to treatment are needed.
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
## 2019-12-01 ENCOUNTER — Telehealth (HOSPITAL_COMMUNITY): Payer: Self-pay | Admitting: Emergency Medicine

## 2019-12-01 LAB — CYTOLOGY, (ORAL, ANAL, URETHRAL) ANCILLARY ONLY
Chlamydia: NEGATIVE
Neisseria Gonorrhea: NEGATIVE
Trichomonas: POSITIVE — AB

## 2019-12-01 NOTE — Telephone Encounter (Signed)
Trichomonas is positive. Rx metronidazole was given at the urgent care visit. Pt needs education to please refrain from sexual intercourse for 7 days to give the medicine time to work. Sexual partners need to be notified and tested/treated. Condoms may reduce risk of reinfection. Recheck for further evaluation if symptoms are not improving.   Patient contacted by phone and made aware of    results. Pt verbalized understanding and had all questions answered.   

## 2021-02-18 ENCOUNTER — Emergency Department (HOSPITAL_COMMUNITY)
Admission: EM | Admit: 2021-02-18 | Discharge: 2021-02-18 | Disposition: A | Discharge status: Home / Self Care | Payer: Self-pay | Attending: Emergency Medicine | Admitting: Emergency Medicine

## 2021-02-18 ENCOUNTER — Other Ambulatory Visit: Payer: Self-pay

## 2021-02-18 DIAGNOSIS — R1084 Generalized abdominal pain: Secondary | ICD-10-CM | POA: Insufficient documentation

## 2021-02-18 DIAGNOSIS — R1030 Lower abdominal pain, unspecified: Secondary | ICD-10-CM | POA: Insufficient documentation

## 2021-02-18 LAB — CBC
HCT: 48.4 % (ref 39.0–52.0)
Hemoglobin: 15.9 g/dL (ref 13.0–17.0)
MCH: 30.7 pg (ref 26.0–34.0)
MCHC: 32.9 g/dL (ref 30.0–36.0)
MCV: 93.4 fL (ref 80.0–100.0)
Platelets: 194 10*3/uL (ref 150–400)
RBC: 5.18 MIL/uL (ref 4.22–5.81)
RDW: 12 % (ref 11.5–15.5)
WBC: 4.9 10*3/uL (ref 4.0–10.5)
nRBC: 0 % (ref 0.0–0.2)

## 2021-02-18 LAB — COMPREHENSIVE METABOLIC PANEL
ALT: 17 U/L (ref 0–44)
AST: 26 U/L (ref 15–41)
Albumin: 4.2 g/dL (ref 3.5–5.0)
Alkaline Phosphatase: 52 U/L (ref 38–126)
Anion gap: 5 (ref 5–15)
BUN: 15 mg/dL (ref 6–20)
CO2: 26 mmol/L (ref 22–32)
Calcium: 9 mg/dL (ref 8.9–10.3)
Chloride: 106 mmol/L (ref 98–111)
Creatinine, Ser: 1.34 mg/dL — ABNORMAL HIGH (ref 0.61–1.24)
GFR, Estimated: >60 mL/min (ref >60)
Glucose, Bld: 90 mg/dL (ref 70–99)
Potassium: 4.4 mmol/L (ref 3.5–5.1)
Sodium: 137 mmol/L (ref 135–145)
Total Bilirubin: 1.1 mg/dL (ref 0.3–1.2)
Total Protein: 6.7 g/dL (ref 6.5–8.1)

## 2021-02-18 LAB — URINALYSIS, ROUTINE W REFLEX MICROSCOPIC
Bilirubin Urine: NEGATIVE
Glucose, UA: NEGATIVE mg/dL
Hgb urine dipstick: NEGATIVE
Ketones, ur: NEGATIVE mg/dL
Leukocytes,Ua: NEGATIVE
Nitrite: NEGATIVE
Protein, ur: NEGATIVE mg/dL
Specific Gravity, Urine: 1.018 (ref 1.005–1.030)
pH: 7 (ref 5.0–8.0)

## 2021-02-18 LAB — LIPASE, BLOOD: Lipase: 31 U/L (ref 11–51)

## 2021-02-18 NOTE — ED Notes (Signed)
Pt provided urinal and informed of need for specimen.

## 2021-02-18 NOTE — Discharge Instructions (Signed)
It was our pleasure taking care of you here in the emergency department today  Your labs and urine did not show any significant findings.  This is reassuring.  Return if you have any new or worsening symptoms
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
## 2021-02-18 NOTE — ED Triage Notes (Signed)
Pt complains of an inconsistent lower abdominal pain. Denies n/v/d and has no pain at this time. VSS

## 2021-04-25 ENCOUNTER — Other Ambulatory Visit: Payer: Self-pay

## 2021-04-25 ENCOUNTER — Encounter (HOSPITAL_COMMUNITY): Payer: Self-pay | Admitting: Emergency Medicine

## 2021-04-25 ENCOUNTER — Emergency Department (HOSPITAL_COMMUNITY)
Admission: EM | Admit: 2021-04-25 | Discharge: 2021-04-25 | Disposition: A | Discharge status: Home / Self Care | Payer: Self-pay | Attending: Emergency Medicine | Admitting: Emergency Medicine

## 2021-04-25 DIAGNOSIS — R519 Headache, unspecified: Secondary | ICD-10-CM | POA: Insufficient documentation

## 2021-04-25 DIAGNOSIS — R001 Bradycardia, unspecified: Secondary | ICD-10-CM | POA: Insufficient documentation

## 2021-04-25 LAB — CBC WITH DIFFERENTIAL/PLATELET
Abs Immature Granulocytes: 0.03 10*3/uL (ref 0.00–0.07)
Basophils Absolute: 0 10*3/uL (ref 0.0–0.1)
Basophils Relative: 0 %
Eosinophils Absolute: 0.1 10*3/uL (ref 0.0–0.5)
Eosinophils Relative: 1 %
HCT: 43.4 % (ref 39.0–52.0)
Hemoglobin: 14.9 g/dL (ref 13.0–17.0)
Immature Granulocytes: 0 %
Lymphocytes Relative: 29 %
Lymphs Abs: 2 10*3/uL (ref 0.7–4.0)
MCH: 31.8 pg (ref 26.0–34.0)
MCHC: 34.3 g/dL (ref 30.0–36.0)
MCV: 92.7 fL (ref 80.0–100.0)
Monocytes Absolute: 0.5 10*3/uL (ref 0.1–1.0)
Monocytes Relative: 6 %
Neutro Abs: 4.4 10*3/uL (ref 1.7–7.7)
Neutrophils Relative %: 64 %
Platelets: 189 10*3/uL (ref 150–400)
RBC: 4.68 MIL/uL (ref 4.22–5.81)
RDW: 12.3 % (ref 11.5–15.5)
WBC: 7 10*3/uL (ref 4.0–10.5)
nRBC: 0 % (ref 0.0–0.2)

## 2021-04-25 LAB — BASIC METABOLIC PANEL
Anion gap: 4 — ABNORMAL LOW (ref 5–15)
BUN: 11 mg/dL (ref 6–20)
CO2: 27 mmol/L (ref 22–32)
Calcium: 9 mg/dL (ref 8.9–10.3)
Chloride: 107 mmol/L (ref 98–111)
Creatinine, Ser: 1.2 mg/dL (ref 0.61–1.24)
GFR, Estimated: >60 mL/min (ref >60)
Glucose, Bld: 115 mg/dL — ABNORMAL HIGH (ref 70–99)
Potassium: 3.8 mmol/L (ref 3.5–5.1)
Sodium: 138 mmol/L (ref 135–145)

## 2021-04-25 MED ORDER — DEXAMETHASONE SODIUM PHOSPHATE 10 MG/ML IJ SOLN
10.0000 mg | Freq: Once | INTRAMUSCULAR | Status: AC
  Administered 2021-04-25: 10 mg via INTRAVENOUS
  Filled 2021-04-25: qty 1

## 2021-04-25 MED ORDER — METOCLOPRAMIDE HCL 5 MG/ML IJ SOLN
10.0000 mg | Freq: Once | INTRAMUSCULAR | Status: AC
  Administered 2021-04-25: 10 mg via INTRAVENOUS
  Filled 2021-04-25: qty 2

## 2021-04-25 MED ORDER — KETOROLAC TROMETHAMINE 15 MG/ML IJ SOLN
15.0000 mg | Freq: Once | INTRAMUSCULAR | Status: AC
  Administered 2021-04-25: 15 mg via INTRAVENOUS
  Filled 2021-04-25: qty 1

## 2021-04-25 MED ORDER — LACTATED RINGERS IV BOLUS
1000.0000 mL | Freq: Once | INTRAVENOUS | Status: AC
  Administered 2021-04-25: 1000 mL via INTRAVENOUS

## 2021-04-25 NOTE — ED Provider Notes (Signed)
Emergency Medicine Provider Triage Evaluation Note  Austin Ware , a 30 y.o. male  was evaluated in triage.  Pt complains of headache.  Right-sided headache described as constant throbbing present for 4 days associated with light sensitivity and 1 episode of vomiting.  No numbness weakness or tingling.  No prior history of similar headaches.  Review of Systems  Positive: Headache, light sensitivity, vomiting Negative: Numbness, weakness, fever  Physical Exam  BP 120/71 (BP Location: Left Arm)   Pulse (!) 46   Temp 98.7 F (37.1 C) (Oral)   Resp 16   SpO2 100%  Gen:   Awake, no distress   Resp:  Normal effort  MSK:   Moves extremities without difficulty  Other:  Moving all extremities without difficulty, normal speech, no facial droop  Medical Decision Making  Medically screening exam initiated at 3:28 PM.  Appropriate orders placed.  Austin Ware was informed that the remainder of the evaluation will be completed by another provider, this initial triage assessment does not replace that evaluation, and the importance of remaining in the ED until their evaluation is complete.     Dartha Lodge, PA-C 04/25/21 1532    Derwood Kaplan, MD 04/26/21 1019

## 2021-04-25 NOTE — ED Triage Notes (Signed)
Pt c/o right sided headache x 4 days. Denies sensitivity to light and sound, A&O x 4, no neuro deficits noted at this time.
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
## 2021-04-25 NOTE — ED Notes (Signed)
Patient verbalized understanding of discharge instructions. Opportunity for questions and answers.  

## 2021-04-25 NOTE — ED Notes (Signed)
Patient called for triage with no response.  

## 2021-04-25 NOTE — Discharge Instructions (Addendum)
Headache Your lab results showed no acute abnormalities.  For future headaches please try the following regimen: Antiinflammatory medications: Take 600 mg of ibuprofen every 6 hours or 440 mg (over the counter dose) to 500 mg (prescription dose) of naproxen every 12 hours.  Use this regimen until headache subsides for up to 3 days .  After this time, these medications may be used as needed for pain. Take these medications with food to avoid upset stomach. Choose only one of these medications, do not take them together. Acetaminophen: Should you continue to have additional pain while taking the ibuprofen or naproxen, you may add in acetaminophen (generic for Tylenol) as needed. Your daily total maximum amount of acetaminophen from all sources should be limited to 4000mg/day for persons without liver problems, or 2000mg/day for those with liver problems.  Hydration: Have a goal of about a half liter of water every couple hours to stay well hydrated.   Sleep: Please be sure to get plenty of sleep with a goal of 8 hours per night. Having a regular bed time and bedtime routine can help with this.  Screens: Reduce the amount of time you are in front of screens.  Take about a 5-10-minute break every hour or every couple hours to give your eyes rest.  Do not use screens in dark rooms.  Glasses with a blue light filter may also help reduce eye fatigue.  Stress: Take steps to reduce stress as much as possible.   Follow up: Follow-up with your primary care provider on this issue.  May also need to follow-up with the neurologist for increased frequency of headaches. 

## 2021-05-16 ENCOUNTER — Emergency Department (HOSPITAL_COMMUNITY): Admission: EM | Admit: 2021-05-16 | Discharge: 2021-05-16 | Discharge status: Against Medical Advice | Payer: Self-pay

## 2021-05-16 ENCOUNTER — Other Ambulatory Visit: Payer: Self-pay

## 2021-10-05 ENCOUNTER — Emergency Department (HOSPITAL_COMMUNITY)
Admission: EM | Admit: 2021-10-05 | Discharge: 2021-10-06 | Disposition: A | Discharge status: Home / Self Care | Payer: Self-pay | Attending: Emergency Medicine | Admitting: Emergency Medicine

## 2021-10-05 ENCOUNTER — Other Ambulatory Visit: Payer: Self-pay

## 2021-10-05 DIAGNOSIS — R112 Nausea with vomiting, unspecified: Secondary | ICD-10-CM | POA: Insufficient documentation

## 2021-10-05 DIAGNOSIS — R519 Headache, unspecified: Secondary | ICD-10-CM | POA: Insufficient documentation

## 2021-10-05 LAB — CBC WITH DIFFERENTIAL/PLATELET
Abs Immature Granulocytes: 0.03 10*3/uL (ref 0.00–0.07)
Basophils Absolute: 0 10*3/uL (ref 0.0–0.1)
Basophils Relative: 0 %
Eosinophils Absolute: 0.1 10*3/uL (ref 0.0–0.5)
Eosinophils Relative: 1 %
HCT: 46.1 % (ref 39.0–52.0)
Hemoglobin: 15.7 g/dL (ref 13.0–17.0)
Immature Granulocytes: 0 %
Lymphocytes Relative: 28 %
Lymphs Abs: 2 10*3/uL (ref 0.7–4.0)
MCH: 31.8 pg (ref 26.0–34.0)
MCHC: 34.1 g/dL (ref 30.0–36.0)
MCV: 93.5 fL (ref 80.0–100.0)
Monocytes Absolute: 0.4 10*3/uL (ref 0.1–1.0)
Monocytes Relative: 5 %
Neutro Abs: 4.9 10*3/uL (ref 1.7–7.7)
Neutrophils Relative %: 66 %
Platelets: 178 10*3/uL (ref 150–400)
RBC: 4.93 MIL/uL (ref 4.22–5.81)
RDW: 12.4 % (ref 11.5–15.5)
WBC: 7.4 10*3/uL (ref 4.0–10.5)
nRBC: 0 % (ref 0.0–0.2)

## 2021-10-05 LAB — COMPREHENSIVE METABOLIC PANEL
ALT: 18 U/L (ref 0–44)
AST: 33 U/L (ref 15–41)
Albumin: 4.1 g/dL (ref 3.5–5.0)
Alkaline Phosphatase: 57 U/L (ref 38–126)
Anion gap: 5 (ref 5–15)
BUN: 6 mg/dL (ref 6–20)
CO2: 27 mmol/L (ref 22–32)
Calcium: 9.2 mg/dL (ref 8.9–10.3)
Chloride: 104 mmol/L (ref 98–111)
Creatinine, Ser: 1.26 mg/dL — ABNORMAL HIGH (ref 0.61–1.24)
GFR, Estimated: >60 mL/min (ref >60)
Glucose, Bld: 101 mg/dL — ABNORMAL HIGH (ref 70–99)
Potassium: 4.1 mmol/L (ref 3.5–5.1)
Sodium: 136 mmol/L (ref 135–145)
Total Bilirubin: 0.8 mg/dL (ref 0.3–1.2)
Total Protein: 6.5 g/dL (ref 6.5–8.1)

## 2021-10-05 NOTE — ED Triage Notes (Signed)
N/v and abdominal pain

## 2021-10-05 NOTE — ED Provider Notes (Signed)
Emergency Medicine Provider Triage Evaluation Note  Austin Ware , a 30 y.o. male  was evaluated in triage.  Pt complains of headache.  His pain has been present for 6 days.  The pain is localized on the right temple.  He denies any trauma.  He denies any vision changes.  He states that he has tried multiple medications at home without relief.  He states the pain is severe enough that he says he got meds "off the street" to try and help his pain with out relief.   Review of Systems  Positive: Headache Negative:   Physical Exam  BP (!) 124/93 (BP Location: Right Arm)    Pulse (!) 47    Temp 99.1 F (37.3 C) (Oral)    Resp 18    SpO2 100%  Gen:   Awake, no distress   Resp:  Normal effort  MSK:   Moves extremities without difficulty  Other:  Patient is awake and alert.  Speech is nonslurred.  He has tenderness to palpation over the right temple.  Pain increased with opening mouth  Medical Decision Making  Medically screening exam initiated at 5:48 PM.  Appropriate orders placed.  Austin Ware was informed that the remainder of the evaluation will be completed by another provider, this initial triage assessment does not replace that evaluation, and the importance of remaining in the ED until their evaluation is complete.  He states that he has had similar before.  He states that when he has had similar symptoms he has had CT scans that were normal, does not think he needs one today.  No fevers reported.  Note: Portions of this report may have been transcribed using voice recognition software. Every effort was made to ensure accuracy; however, inadvertent computerized transcription errors may be present    Cristina Gong, PA-C 10/05/21 1752    Arby Barrette, MD 10/08/21 [phone]

## 2021-10-06 ENCOUNTER — Emergency Department (HOSPITAL_COMMUNITY): Payer: Self-pay

## 2021-10-06 MED ORDER — SODIUM CHLORIDE 0.9 % IV BOLUS
1000.0000 mL | Freq: Once | INTRAVENOUS | Status: AC
  Administered 2021-10-06: 1000 mL via INTRAVENOUS

## 2021-10-06 MED ORDER — METOCLOPRAMIDE HCL 5 MG/ML IJ SOLN
10.0000 mg | Freq: Once | INTRAMUSCULAR | Status: AC
  Administered 2021-10-06: 10 mg via INTRAVENOUS
  Filled 2021-10-06: qty 2

## 2021-10-06 MED ORDER — DIPHENHYDRAMINE HCL 50 MG/ML IJ SOLN
25.0000 mg | Freq: Once | INTRAMUSCULAR | Status: AC
  Administered 2021-10-06: 25 mg via INTRAVENOUS
  Filled 2021-10-06: qty 1

## 2021-10-06 NOTE — ED Notes (Signed)
To ct
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
## 2021-10-06 NOTE — ED Notes (Signed)
The pt does not want to  leave hes sleeping and reports that hes driving himself home

## 2021-10-06 NOTE — Discharge Instructions (Addendum)
I have given you a referral to [facility] neurology as well as [place] [facility].  Please give them both a call and schedule an appointment for reevaluation of your recurrent headaches.  If you develop any new or worsening symptoms please come back to the emergency department immediately.

## 2021-10-06 NOTE — ED Notes (Signed)
The pt has mifraine headaches and he has had this one for 6 days  over the counter meds not helping

## 2021-10-06 NOTE — ED Notes (Signed)
The pt is sleeping his iv is still infusing

## 2021-10-11 ENCOUNTER — Ambulatory Visit: Payer: Self-pay | Admitting: *Deleted

## 2021-10-11 ENCOUNTER — Telehealth: Payer: Self-pay | Admitting: *Deleted

## 2021-10-11 NOTE — Telephone Encounter (Signed)
Patient has appointment for 2/23 for new patient. Patient may use OTC pain medication for migraines until establish care.

## 2021-10-11 NOTE — Telephone Encounter (Signed)
°  Chief Complaint: Headache Symptoms: Severe migraine Frequency: Seen in ED 10/05/21 Pertinent Negatives: NA Disposition: [] ED /[] Urgent Care (no appt availability in office) / [] Appointment(In office/virtual)/ [x]  [facility]/ [] Home Care/ [] Refused Recommended Disposition  Additional Notes: Pt's wife calling, not able to speak to pt, "In room in dark." States unable to eat, work with "Migraine headache." CT scan in ED negative. Advised to F/U with [facility], no availability until Feb. Advised ED, declines. ADvised Evisit, declined. Wife states "We'll figure out something thank you, hung up.      Reason for Disposition  [1] SEVERE headache (e.g., excruciating) AND [2] "worst headache" of life  Answer Assessment - Initial Assessment Questions 1. LOCATION: "Where does it hurt?"      Please see summary 2. ONSET: "When did the headache start?" (Minutes, hours or days)      *No Answer* 3. PATTERN: "Does the pain come and go, or has it been constant since it started?"     *No Answer* 4. SEVERITY: "How bad is the pain?" and "What does it keep you from doing?"  (e.g., Scale 1-10; mild, moderate, or severe)   - MILD (1-3): doesn't interfere with normal activities    - MODERATE (4-7): interferes with normal activities or awakens from sleep    - SEVERE (8-10): excruciating pain, unable to do any normal activities        *No Answer* 5. RECURRENT SYMPTOM: "Have you ever had headaches before?" If Yes, ask: "When was the last time?" and "What happened that time?"      *No Answer* 6. CAUSE: "What do you think is causing the headache?"     *No Answer* 7. MIGRAINE: "Have you been diagnosed with migraine headaches?" If Yes, ask: "Is this headache similar?"      *No Answer* 8. HEAD INJURY: "Has there been any recent injury to the head?"      *No Answer* 9. OTHER SYMPTOMS: "Do you have any other symptoms?" (fever, stiff neck, eye pain, sore throat, cold symptoms)     *No Answer* 10. PREGNANCY:  "Is there any chance you are pregnant?" "When was your last menstrual period?"       *No Answer*  Protocols used: Headache-A-AH

## 2021-12-15 ENCOUNTER — Ambulatory Visit: Payer: Self-pay | Admitting: Critical Care Medicine
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
## 2022-01-05 ENCOUNTER — Ambulatory Visit (HOSPITAL_COMMUNITY)
Admission: EM | Admit: 2022-01-05 | Discharge: 2022-01-05 | Disposition: A | Discharge status: Home / Self Care | Payer: Self-pay | Attending: Family Medicine | Admitting: Family Medicine

## 2022-01-05 ENCOUNTER — Encounter (HOSPITAL_COMMUNITY): Payer: Self-pay | Admitting: *Deleted

## 2022-01-05 DIAGNOSIS — K047 Periapical abscess without sinus: Secondary | ICD-10-CM

## 2022-01-05 DIAGNOSIS — K0889 Other specified disorders of teeth and supporting structures: Secondary | ICD-10-CM

## 2022-01-05 MED ORDER — HYDROCODONE-ACETAMINOPHEN 5-325 MG PO TABS: 1.0000 | ORAL_TABLET | Freq: Four times a day (QID) | ORAL | 0 refills | Status: DC | PRN

## 2022-01-05 MED ORDER — IBUPROFEN 800 MG PO TABS: 800.0000 mg | ORAL_TABLET | Freq: Three times a day (TID) | ORAL | 0 refills | Status: DC

## 2022-01-05 MED ORDER — CLINDAMYCIN HCL 300 MG PO CAPS: 300.0000 mg | ORAL_CAPSULE | Freq: Three times a day (TID) | ORAL | 0 refills | Status: DC

## 2022-01-05 NOTE — ED Triage Notes (Signed)
C/O right upper and lower dental pain onset "months" ago; today has significant right facial swelling. No known fevers. Has dentist appt tomorrow. ?

## 2022-01-05 NOTE — Discharge Instructions (Signed)

## 2022-01-06 ENCOUNTER — Telehealth (HOSPITAL_COMMUNITY): Payer: Self-pay | Admitting: Family Medicine

## 2022-01-06 MED ORDER — TRAMADOL HCL 50 MG PO TABS: 50.0000 mg | ORAL_TABLET | Freq: Four times a day (QID) | ORAL | 0 refills | Status: DC | PRN

## 2022-01-06 NOTE — Telephone Encounter (Signed)
[facility] states no hydrocodone at their pharmacies. Seen yesterday. Nothing on pmp. I have sent tramadol instead. ?
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
## 2022-07-17 ENCOUNTER — Emergency Department (HOSPITAL_COMMUNITY): Payer: Self-pay

## 2022-07-17 ENCOUNTER — Emergency Department (HOSPITAL_COMMUNITY)
Admission: EM | Admit: 2022-07-17 | Discharge: 2022-07-17 | Discharge status: Against Medical Advice | Payer: Self-pay | Attending: Emergency Medicine | Admitting: Emergency Medicine

## 2022-07-17 ENCOUNTER — Other Ambulatory Visit: Payer: Self-pay

## 2022-07-17 DIAGNOSIS — Z5321 Procedure and treatment not carried out due to patient leaving prior to being seen by health care provider: Secondary | ICD-10-CM | POA: Insufficient documentation

## 2022-07-17 DIAGNOSIS — R110 Nausea: Secondary | ICD-10-CM | POA: Insufficient documentation

## 2022-07-17 DIAGNOSIS — R1031 Right lower quadrant pain: Secondary | ICD-10-CM | POA: Insufficient documentation

## 2022-07-17 DIAGNOSIS — R197 Diarrhea, unspecified: Secondary | ICD-10-CM | POA: Insufficient documentation

## 2022-07-17 LAB — URINALYSIS, ROUTINE W REFLEX MICROSCOPIC
Bilirubin Urine: NEGATIVE
Glucose, UA: NEGATIVE mg/dL
Hgb urine dipstick: NEGATIVE
Ketones, ur: NEGATIVE mg/dL
Leukocytes,Ua: NEGATIVE
Nitrite: NEGATIVE
Protein, ur: NEGATIVE mg/dL
Specific Gravity, Urine: >1.03 — ABNORMAL HIGH (ref 1.005–1.030)
pH: 5.5 (ref 5.0–8.0)

## 2022-07-17 LAB — COMPREHENSIVE METABOLIC PANEL
ALT: 18 U/L (ref 0–44)
AST: 21 U/L (ref 15–41)
Albumin: 4.8 g/dL (ref 3.5–5.0)
Alkaline Phosphatase: 57 U/L (ref 38–126)
Anion gap: 11 (ref 5–15)
BUN: 10 mg/dL (ref 6–20)
CO2: 23 mmol/L (ref 22–32)
Calcium: 9.8 mg/dL (ref 8.9–10.3)
Chloride: 106 mmol/L (ref 98–111)
Creatinine, Ser: 1.04 mg/dL (ref 0.61–1.24)
GFR, Estimated: >60 mL/min (ref >60)
Glucose, Bld: 88 mg/dL (ref 70–99)
Potassium: 4.2 mmol/L (ref 3.5–5.1)
Sodium: 140 mmol/L (ref 135–145)
Total Bilirubin: 1.4 mg/dL — ABNORMAL HIGH (ref 0.3–1.2)
Total Protein: 7.7 g/dL (ref 6.5–8.1)

## 2022-07-17 LAB — CBC
HCT: 50.1 % (ref 39.0–52.0)
Hemoglobin: 17.7 g/dL — ABNORMAL HIGH (ref 13.0–17.0)
MCH: 31.6 pg (ref 26.0–34.0)
MCHC: 35.3 g/dL (ref 30.0–36.0)
MCV: 89.5 fL (ref 80.0–100.0)
Platelets: 204 10*3/uL (ref 150–400)
RBC: 5.6 MIL/uL (ref 4.22–5.81)
RDW: 11.9 % (ref 11.5–15.5)
WBC: 11.8 10*3/uL — ABNORMAL HIGH (ref 4.0–10.5)
nRBC: 0 % (ref 0.0–0.2)

## 2022-07-17 LAB — LIPASE, BLOOD: Lipase: 28 U/L (ref 11–51)

## 2022-07-17 NOTE — ED Provider Triage Note (Signed)
Emergency Medicine Provider Triage Evaluation Note  Austin Ware , a 31 y.o. male  was evaluated in triage.  Pt complains of abdominal pain, diarrhea.  Began yesterday.  Some nausea without emesis.  He is having diarrhea.  Pain located to right lower quadrant and right mid abdomen.  Hx of SBO.  Feels different.  Review of Systems  Positive: Abd pain, diarrhea Negative:   Physical Exam  BP 116/80 (BP Location: Left Arm)   Pulse 68   Temp 98 F (36.7 C)   Resp 16   SpO2 99%  Gen:   Awake, no distress   Resp:  Normal effort  MSK:   Moves extremities without difficulty  ABD:  Diffuse tenderness to RLQ and right mid abd Other:    Medical Decision Making  Medically screening exam initiated at 2:50 PM.  Appropriate orders placed.  Austin Ware was informed that the remainder of the evaluation will be completed by another provider, this initial triage assessment does not replace that evaluation, and the importance of remaining in the ED until their evaluation is complete.  RLQ abd pain   Abi Shoults A, PA-C 07/17/22 1451

## 2022-07-17 NOTE — ED Triage Notes (Signed)
Pt with recent hx of bowel obstruction here for eval of abdominal pain since yesterday. Endorses nausea without vomiting and diarrhea.

## 2022-07-17 NOTE — ED Notes (Signed)
Pt called multiple times by CT staff and ED staff, no answer

## 2022-11-17 ENCOUNTER — Emergency Department (HOSPITAL_COMMUNITY): Payer: Self-pay

## 2022-11-17 ENCOUNTER — Emergency Department (HOSPITAL_COMMUNITY)
Admission: EM | Admit: 2022-11-17 | Discharge: 2022-11-17 | Disposition: A | Discharge status: Home / Self Care | Payer: Self-pay | Attending: Emergency Medicine | Admitting: Emergency Medicine

## 2022-11-17 DIAGNOSIS — R1084 Generalized abdominal pain: Secondary | ICD-10-CM | POA: Insufficient documentation

## 2022-11-17 DIAGNOSIS — M5136 Other intervertebral disc degeneration, lumbar region: Secondary | ICD-10-CM | POA: Insufficient documentation

## 2022-11-17 LAB — COMPREHENSIVE METABOLIC PANEL
ALT: 16 U/L (ref 0–44)
AST: 20 U/L (ref 15–41)
Albumin: 4 g/dL (ref 3.5–5.0)
Alkaline Phosphatase: 48 U/L (ref 38–126)
Anion gap: 6 (ref 5–15)
BUN: 9 mg/dL (ref 6–20)
CO2: 25 mmol/L (ref 22–32)
Calcium: 9.1 mg/dL (ref 8.9–10.3)
Chloride: 108 mmol/L (ref 98–111)
Creatinine, Ser: 1.07 mg/dL (ref 0.61–1.24)
GFR, Estimated: >60 mL/min (ref >60)
Glucose, Bld: 95 mg/dL (ref 70–99)
Potassium: 4.5 mmol/L (ref 3.5–5.1)
Sodium: 139 mmol/L (ref 135–145)
Total Bilirubin: 0.6 mg/dL (ref 0.3–1.2)
Total Protein: 6.4 g/dL — ABNORMAL LOW (ref 6.5–8.1)

## 2022-11-17 LAB — CBC
HCT: 47 % (ref 39.0–52.0)
Hemoglobin: 16.5 g/dL (ref 13.0–17.0)
MCH: 32 pg (ref 26.0–34.0)
MCHC: 35.1 g/dL (ref 30.0–36.0)
MCV: 91.3 fL (ref 80.0–100.0)
Platelets: 188 10*3/uL (ref 150–400)
RBC: 5.15 MIL/uL (ref 4.22–5.81)
RDW: 12 % (ref 11.5–15.5)
WBC: 5.8 10*3/uL (ref 4.0–10.5)
nRBC: 0 % (ref 0.0–0.2)

## 2022-11-17 LAB — URINALYSIS, ROUTINE W REFLEX MICROSCOPIC
Bilirubin Urine: NEGATIVE
Glucose, UA: NEGATIVE mg/dL
Hgb urine dipstick: NEGATIVE
Ketones, ur: NEGATIVE mg/dL
Leukocytes,Ua: NEGATIVE
Nitrite: NEGATIVE
Protein, ur: NEGATIVE mg/dL
Specific Gravity, Urine: 1.025 (ref 1.005–1.030)
pH: 7 (ref 5.0–8.0)

## 2022-11-17 LAB — LIPASE, BLOOD: Lipase: 33 U/L (ref 11–51)

## 2022-11-17 LAB — HIV ANTIBODY (ROUTINE TESTING W REFLEX): HIV Screen 4th Generation wRfx: NONREACTIVE

## 2022-11-17 MED ORDER — LACTATED RINGERS IV BOLUS
1000.0000 mL | Freq: Once | INTRAVENOUS | Status: AC
  Administered 2022-11-17: 1000 mL via INTRAVENOUS

## 2022-11-17 MED ORDER — MORPHINE SULFATE (PF) 4 MG/ML IV SOLN
4.0000 mg | Freq: Once | INTRAVENOUS | Status: AC
  Administered 2022-11-17: 4 mg via INTRAVENOUS
  Filled 2022-11-17: qty 1

## 2022-11-17 MED ORDER — ONDANSETRON HCL 4 MG/2ML IJ SOLN
4.0000 mg | Freq: Once | INTRAMUSCULAR | Status: AC
  Administered 2022-11-17: 4 mg via INTRAVENOUS
  Filled 2022-11-17: qty 2

## 2022-11-17 MED ORDER — IOHEXOL 350 MG/ML SOLN
75.0000 mL | Freq: Once | INTRAVENOUS | Status: AC | PRN
  Administered 2022-11-17: 75 mL via INTRAVENOUS

## 2022-11-17 NOTE — ED Triage Notes (Signed)
Patient here for evaluation of intermittent sharp lower abdominal pain that started on the RLQ three weeks ago but this week now also hurts on the LLQ. Patient is sharp, denies diarrhea and vomiting. Patient reports history of a bowel blockage but states this pain feels different, patient also reports being short in the abdomen several years ago. Patient is alert, oriented, and in no apparent distress at this time.

## 2022-11-17 NOTE — Discharge Instructions (Addendum)
You were evaluated today for abdominal pain.  Your CT was reassuring for no acute cause of your pain.  You do have some signs of degenerative disc disease in the lumbar spine.  Please continue to take over-the-counter medication such as acetaminophen and ibuprofen as needed for pain control.  I recommend establishing care with a primary care provider for further follow-up as needed.  You may follow MyChart for results of the STI testing.  If positive you will need antibiotic treatment.  Please return to the emergency department if you develop any life-threatening symptoms
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
## 2023-02-28 ENCOUNTER — Emergency Department (HOSPITAL_COMMUNITY): Payer: Self-pay

## 2023-02-28 ENCOUNTER — Encounter (HOSPITAL_COMMUNITY): Payer: Self-pay | Admitting: Emergency Medicine

## 2023-02-28 ENCOUNTER — Other Ambulatory Visit: Payer: Self-pay

## 2023-02-28 ENCOUNTER — Inpatient Hospital Stay (HOSPITAL_COMMUNITY)
Admission: EM | Admit: 2023-02-28 | Discharge: 2023-03-04 | DRG: 390 | Disposition: A | Discharge status: Home / Self Care | Payer: Self-pay | Attending: Internal Medicine | Admitting: Internal Medicine

## 2023-02-28 DIAGNOSIS — S31139S Puncture wound of abdominal wall without foreign body, unspecified quadrant without penetration into peritoneal cavity, sequela: Secondary | ICD-10-CM

## 2023-02-28 DIAGNOSIS — Z5982 Transportation insecurity: Secondary | ICD-10-CM

## 2023-02-28 DIAGNOSIS — W3400XS Accidental discharge from unspecified firearms or gun, sequela: Secondary | ICD-10-CM

## 2023-02-28 DIAGNOSIS — S3669XS Other injury of rectum, sequela: Secondary | ICD-10-CM

## 2023-02-28 DIAGNOSIS — K56609 Unspecified intestinal obstruction, unspecified as to partial versus complete obstruction: Secondary | ICD-10-CM | POA: Diagnosis present

## 2023-02-28 DIAGNOSIS — Z597 Insufficient social insurance and welfare support: Secondary | ICD-10-CM

## 2023-02-28 DIAGNOSIS — Z792 Long term (current) use of antibiotics: Secondary | ICD-10-CM

## 2023-02-28 DIAGNOSIS — K5669 Other partial intestinal obstruction: Principal | ICD-10-CM | POA: Diagnosis present

## 2023-02-28 LAB — CBC
HCT: 46.5 % (ref 39.0–52.0)
Hemoglobin: 16.2 g/dL (ref 13.0–17.0)
MCH: 31.6 pg (ref 26.0–34.0)
MCHC: 34.8 g/dL (ref 30.0–36.0)
MCV: 90.6 fL (ref 80.0–100.0)
Platelets: 198 10*3/uL (ref 150–400)
RBC: 5.13 MIL/uL (ref 4.22–5.81)
RDW: 12 % (ref 11.5–15.5)
WBC: 9.7 10*3/uL (ref 4.0–10.5)
nRBC: 0 % (ref 0.0–0.2)

## 2023-02-28 LAB — LIPASE, BLOOD: Lipase: 28 U/L (ref 11–51)

## 2023-02-28 LAB — COMPREHENSIVE METABOLIC PANEL
ALT: 18 U/L (ref 0–44)
AST: 26 U/L (ref 15–41)
Albumin: 4.4 g/dL (ref 3.5–5.0)
Alkaline Phosphatase: 54 U/L (ref 38–126)
Anion gap: 10 (ref 5–15)
BUN: 9 mg/dL (ref 6–20)
CO2: 26 mmol/L (ref 22–32)
Calcium: 9.3 mg/dL (ref 8.9–10.3)
Chloride: 104 mmol/L (ref 98–111)
Creatinine, Ser: 1.06 mg/dL (ref 0.61–1.24)
GFR, Estimated: >60 mL/min (ref >60)
Glucose, Bld: 90 mg/dL (ref 70–99)
Potassium: 3.9 mmol/L (ref 3.5–5.1)
Sodium: 140 mmol/L (ref 135–145)
Total Bilirubin: 0.7 mg/dL (ref 0.3–1.2)
Total Protein: 7 g/dL (ref 6.5–8.1)

## 2023-02-28 MED ORDER — ONDANSETRON HCL 4 MG/2ML IJ SOLN
4.0000 mg | Freq: Once | INTRAMUSCULAR | Status: AC
  Administered 2023-02-28: 4 mg via INTRAVENOUS
  Filled 2023-02-28: qty 2

## 2023-02-28 MED ORDER — ACETAMINOPHEN 325 MG PO TABS: 650.0000 mg | ORAL_TABLET | Freq: Four times a day (QID) | ORAL | Status: DC | PRN

## 2023-02-28 MED ORDER — IOHEXOL 350 MG/ML SOLN
75.0000 mL | Freq: Once | INTRAVENOUS | Status: AC | PRN
  Administered 2023-02-28: 75 mL via INTRAVENOUS

## 2023-02-28 MED ORDER — OXYCODONE-ACETAMINOPHEN 5-325 MG PO TABS
1.0000 | ORAL_TABLET | Freq: Once | ORAL | Status: AC
  Administered 2023-02-28: 1 via ORAL
  Filled 2023-02-28: qty 1

## 2023-02-28 MED ORDER — ONDANSETRON HCL 4 MG/2ML IJ SOLN
4.0000 mg | Freq: Four times a day (QID) | INTRAMUSCULAR | Status: DC | PRN
  Administered 2023-03-01: 4 mg via INTRAVENOUS
  Filled 2023-02-28: qty 2

## 2023-02-28 MED ORDER — ENOXAPARIN SODIUM 40 MG/0.4ML IJ SOSY
40.0000 mg | PREFILLED_SYRINGE | INTRAMUSCULAR | Status: DC
  Administered 2023-03-01 – 2023-03-04 (×4): 40 mg via SUBCUTANEOUS
  Filled 2023-02-28 (×4): qty 0.4

## 2023-02-28 MED ORDER — LACTATED RINGERS IV SOLN: INTRAVENOUS | Status: DC

## 2023-02-28 MED ORDER — ONDANSETRON HCL 4 MG PO TABS: 4.0000 mg | ORAL_TABLET | Freq: Four times a day (QID) | ORAL | Status: DC | PRN

## 2023-02-28 MED ORDER — SODIUM CHLORIDE 0.9 % IV BOLUS
1000.0000 mL | Freq: Once | INTRAVENOUS | Status: AC
  Administered 2023-02-28: 1000 mL via INTRAVENOUS

## 2023-02-28 MED ORDER — MORPHINE SULFATE (PF) 2 MG/ML IV SOLN
2.0000 mg | INTRAVENOUS | Status: DC | PRN
  Administered 2023-03-01 – 2023-03-02 (×6): 2 mg via INTRAVENOUS
  Filled 2023-02-28 (×6): qty 1

## 2023-02-28 MED ORDER — MORPHINE SULFATE (PF) 4 MG/ML IV SOLN
4.0000 mg | Freq: Once | INTRAVENOUS | Status: AC
  Administered 2023-02-28: 4 mg via INTRAVENOUS
  Filled 2023-02-28: qty 1

## 2023-02-28 MED ORDER — ACETAMINOPHEN 650 MG RE SUPP: 650.0000 mg | Freq: Four times a day (QID) | RECTAL | Status: DC | PRN

## 2023-02-28 MED ORDER — DIATRIZOATE MEGLUMINE & SODIUM 66-10 % PO SOLN
90.0000 mL | Freq: Once | ORAL | Status: AC
  Administered 2023-02-28: 90 mL via NASOGASTRIC
  Filled 2023-02-28: qty 90

## 2023-02-28 MED ORDER — MORPHINE SULFATE (PF) 2 MG/ML IV SOLN: 2.0000 mg | INTRAVENOUS | Status: DC | PRN
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
## 2023-02-28 NOTE — ED Notes (Signed)
Radiology notified that Gastrografin administered
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
## 2023-02-28 NOTE — Assessment & Plan Note (Addendum)
-  Patient presented with abdominal pain, nausea vomiting, CT abdomen and pelvis done concerning for small bowel obstruction.   -Patient admitted, placed on bowel rest, NG tube placed. -Patient placed on bowel rest, IV fluids, pain management, supportive care.   -Patient seen in consultation by general surgery who initially recommended conservative management and if no improvement within 24 to 48 hours may need ex lap.   -Patient improved clinically during the hospitalization, passing flatus, having bowel movements.   -Clamping trial undertaken which patient tolerated and NG tube subsequently discontinued.   -Patient placed on small bowel obstruction protocol with abdominal films showing nonobstructive bowel gas pattern. -Diet advanced to a soft diet which patient tolerated, abdominal pain improved, no further nausea or vomiting, patient improved clinically.  -Patient followed by general surgery during the hospitalization and cleared for discharge.   -Outpatient follow-up with PCP.   -Outpatient follow-up with general surgery as needed.
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
## 2023-03-01 ENCOUNTER — Inpatient Hospital Stay (HOSPITAL_COMMUNITY): Payer: Self-pay

## 2023-03-01 LAB — CBC
HCT: 47.1 % (ref 39.0–52.0)
Hemoglobin: 16.3 g/dL (ref 13.0–17.0)
MCH: 31.3 pg (ref 26.0–34.0)
MCHC: 34.6 g/dL (ref 30.0–36.0)
MCV: 90.6 fL (ref 80.0–100.0)
Platelets: 184 10*3/uL (ref 150–400)
RBC: 5.2 MIL/uL (ref 4.22–5.81)
RDW: 12.1 % (ref 11.5–15.5)
WBC: 7.6 10*3/uL (ref 4.0–10.5)
nRBC: 0 % (ref 0.0–0.2)

## 2023-03-01 LAB — URINALYSIS, ROUTINE W REFLEX MICROSCOPIC
Bacteria, UA: NONE SEEN
Bilirubin Urine: NEGATIVE
Glucose, UA: NEGATIVE mg/dL
Hgb urine dipstick: NEGATIVE
Ketones, ur: 80 mg/dL — AB
Leukocytes,Ua: NEGATIVE
Nitrite: NEGATIVE
Protein, ur: NEGATIVE mg/dL
Specific Gravity, Urine: >1.046 — ABNORMAL HIGH (ref 1.005–1.030)
pH: 6 (ref 5.0–8.0)

## 2023-03-01 LAB — BASIC METABOLIC PANEL
Anion gap: 11 (ref 5–15)
BUN: 8 mg/dL (ref 6–20)
CO2: 20 mmol/L — ABNORMAL LOW (ref 22–32)
Calcium: 8.9 mg/dL (ref 8.9–10.3)
Chloride: 107 mmol/L (ref 98–111)
Creatinine, Ser: 0.91 mg/dL (ref 0.61–1.24)
GFR, Estimated: >60 mL/min (ref >60)
Glucose, Bld: 101 mg/dL — ABNORMAL HIGH (ref 70–99)
Potassium: 4.1 mmol/L (ref 3.5–5.1)
Sodium: 138 mmol/L (ref 135–145)

## 2023-03-01 LAB — MAGNESIUM: Magnesium: 2.3 mg/dL (ref 1.7–2.4)

## 2023-03-01 MED ORDER — PHENOL 1.4 % MT LIQD
1.0000 | OROMUCOSAL | Status: DC | PRN
  Filled 2023-03-01: qty 177

## 2023-03-01 MED ORDER — LACTATED RINGERS IV SOLN: INTRAVENOUS | Status: DC
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
## 2023-03-01 NOTE — TOC Initial Note (Signed)
Transition of Care (TOC) - Initial/Assessment Note   Spoke to patient at bedside. Patient from home with mother.   Updated face sheet information.   Patient does not have a PCP. However, he has information at home on a provider that he plans on establishing care with. Patient cannot remember providers name or practice name.   Explained if patient can get providers name , staff will add information to his chart.   Patient does not have insurance, in agreement for consult to financial Counselor . Emailed Christia Reading.   Will ask MD to send discharge prescriptions to [facility] . [facility] will call patient will cost, if patient unable to afford will see if patient is eligible for MATCH ( once per year).    Patient voiced understanding.  Patient Details  Name: Austin Ware MRN: [id number] Date of Birth: 02-24-1991  Transition of Care [facility]) CM/SW Contact:    Kingsley Plan, RN Phone Number: 03/01/2023, 10:27 AM  Clinical Narrative:                   Expected Discharge Plan: Home/Self Care Barriers to Discharge: Continued Medical Work up   Patient Goals and CMS Choice Patient states their goals for this hospitalization and ongoing recovery are:: to return to home          Expected Discharge Plan and Services In-house Referral: Financial Counselor Discharge Planning Services: CM Consult   Living arrangements for the past 2 months: Single Family Home                 DME Arranged: N/A         HH Arranged: NA          Prior Living Arrangements/Services Living arrangements for the past 2 months: Single Family Home Lives with:: Parents Patient language and need for interpreter reviewed:: Yes Do you feel safe going back to the place where you live?: Yes      Need for Family Participation in Patient Care: Yes (Comment) Care giver support system in place?: Yes (comment)   Criminal Activity/Legal Involvement Pertinent to Current Situation/Hospitalization:  No - Comment as needed  Activities of Daily Living      Permission Sought/Granted   Permission granted to share information with : No              Emotional Assessment Appearance:: Appears stated age Attitude/Demeanor/Rapport: Engaged Affect (typically observed): Accepting Orientation: : Oriented to Self, Oriented to Place, Oriented to  Time, Oriented to Situation Alcohol / Substance Use: Not Applicable Psych Involvement: No (comment)  Admission diagnosis:  Small bowel obstruction (HCC) [K56.609] Patient Active Problem List   Diagnosis Date Noted   SBO (small bowel obstruction) (HCC) 05/14/2018   PCP:  Patient, No Pcp Per Pharmacy:   [id number] [place], [place] - [address] [place] [address] Phone: [phone] Fax: [phone]  [id number] - [place], [place] - [address] AT [facility] [address]  [place] [address] Phone: [phone] Fax: [phone]  [facility] [address] [place] [address] Phone: [phone] Fax: [phone]     Social Determinants of Health (SDOH) Social History: SDOH Screenings   Tobacco Use: Low Risk  (02/28/2023)   SDOH Interventions:     Readmission Risk Interventions     No data to display

## 2023-03-02 ENCOUNTER — Inpatient Hospital Stay (HOSPITAL_COMMUNITY): Payer: Self-pay

## 2023-03-02 LAB — CBC WITH DIFFERENTIAL/PLATELET
Abs Immature Granulocytes: 0.01 10*3/uL (ref 0.00–0.07)
Basophils Absolute: 0 10*3/uL (ref 0.0–0.1)
Basophils Relative: 0 %
Eosinophils Absolute: 0 10*3/uL (ref 0.0–0.5)
Eosinophils Relative: 0 %
HCT: 43.3 % (ref 39.0–52.0)
Hemoglobin: 15.3 g/dL (ref 13.0–17.0)
Immature Granulocytes: 0 %
Lymphocytes Relative: 20 %
Lymphs Abs: 1.2 10*3/uL (ref 0.7–4.0)
MCH: 31.9 pg (ref 26.0–34.0)
MCHC: 35.3 g/dL (ref 30.0–36.0)
MCV: 90.4 fL (ref 80.0–100.0)
Monocytes Absolute: 0.6 10*3/uL (ref 0.1–1.0)
Monocytes Relative: 10 %
Neutro Abs: 4 10*3/uL (ref 1.7–7.7)
Neutrophils Relative %: 70 %
Platelets: 173 10*3/uL (ref 150–400)
RBC: 4.79 MIL/uL (ref 4.22–5.81)
RDW: 12.1 % (ref 11.5–15.5)
WBC: 5.7 10*3/uL (ref 4.0–10.5)
nRBC: 0 % (ref 0.0–0.2)

## 2023-03-02 LAB — BASIC METABOLIC PANEL
Anion gap: 6 (ref 5–15)
BUN: 10 mg/dL (ref 6–20)
CO2: 28 mmol/L (ref 22–32)
Calcium: 8.4 mg/dL — ABNORMAL LOW (ref 8.9–10.3)
Chloride: 103 mmol/L (ref 98–111)
Creatinine, Ser: 1.1 mg/dL (ref 0.61–1.24)
GFR, Estimated: >60 mL/min (ref >60)
Glucose, Bld: 78 mg/dL (ref 70–99)
Potassium: 3.9 mmol/L (ref 3.5–5.1)
Sodium: 137 mmol/L (ref 135–145)

## 2023-03-02 LAB — MAGNESIUM: Magnesium: 2.2 mg/dL (ref 1.7–2.4)

## 2023-03-02 MED ORDER — POLYETHYLENE GLYCOL 3350 17 G PO PACK
17.0000 g | PACK | Freq: Every day | ORAL | Status: DC
  Filled 2023-03-02 (×2): qty 1
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
## 2023-03-03 LAB — BASIC METABOLIC PANEL
Anion gap: 8 (ref 5–15)
BUN: 7 mg/dL (ref 6–20)
CO2: 27 mmol/L (ref 22–32)
Calcium: 8.9 mg/dL (ref 8.9–10.3)
Chloride: 100 mmol/L (ref 98–111)
Creatinine, Ser: 1.07 mg/dL (ref 0.61–1.24)
GFR, Estimated: >60 mL/min (ref >60)
Glucose, Bld: 94 mg/dL (ref 70–99)
Potassium: 4.2 mmol/L (ref 3.5–5.1)
Sodium: 135 mmol/L (ref 135–145)

## 2023-03-03 LAB — CBC
HCT: 44.8 % (ref 39.0–52.0)
Hemoglobin: 15.4 g/dL (ref 13.0–17.0)
MCH: 30.9 pg (ref 26.0–34.0)
MCHC: 34.4 g/dL (ref 30.0–36.0)
MCV: 90 fL (ref 80.0–100.0)
Platelets: 178 10*3/uL (ref 150–400)
RBC: 4.98 MIL/uL (ref 4.22–5.81)
RDW: 11.9 % (ref 11.5–15.5)
WBC: 6.4 10*3/uL (ref 4.0–10.5)
nRBC: 0 % (ref 0.0–0.2)

## 2023-03-03 LAB — MAGNESIUM: Magnesium: 2 mg/dL (ref 1.7–2.4)
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
## 2023-03-03 NOTE — Plan of Care (Signed)
  Problem: Education: Goal: Knowledge of General Education information will improve Description Including pain rating scale, medication(s)/side effects and non-pharmacologic comfort measures Outcome: Progressing   Problem: Health Behavior/Discharge Planning: Goal: Ability to manage health-related needs will improve Outcome: Progressing   
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
## 2023-03-03 NOTE — Plan of Care (Signed)

## 2023-03-04 LAB — BASIC METABOLIC PANEL
Anion gap: 10 (ref 5–15)
BUN: 7 mg/dL (ref 6–20)
CO2: 26 mmol/L (ref 22–32)
Calcium: 9 mg/dL (ref 8.9–10.3)
Chloride: 100 mmol/L (ref 98–111)
Creatinine, Ser: 1.04 mg/dL (ref 0.61–1.24)
GFR, Estimated: >60 mL/min (ref >60)
Glucose, Bld: 94 mg/dL (ref 70–99)
Potassium: 4.2 mmol/L (ref 3.5–5.1)
Sodium: 136 mmol/L (ref 135–145)

## 2023-03-04 LAB — MAGNESIUM: Magnesium: 1.9 mg/dL (ref 1.7–2.4)

## 2023-03-04 NOTE — Plan of Care (Signed)
  Problem: Education: Goal: Knowledge of General Education information will improve Description: Including pain rating scale, medication(s)/side effects and non-pharmacologic comfort measures 03/04/2023 1143 by Curly Rim, RN Outcome: Adequate for Discharge 03/04/2023 0826 by Curly Rim, RN Outcome: Progressing   Problem: Health Behavior/Discharge Planning: Goal: Ability to manage health-related needs will improve 03/04/2023 1143 by Curly Rim, RN Outcome: Adequate for Discharge 03/04/2023 0826 by Curly Rim, RN Outcome: Progressing   Problem: Clinical Measurements: Goal: Ability to maintain clinical measurements within normal limits will improve 03/04/2023 1143 by Curly Rim, RN Outcome: Adequate for Discharge 03/04/2023 0826 by Curly Rim, RN Outcome: Progressing Goal: Will remain free from infection 03/04/2023 1143 by Curly Rim, RN Outcome: Adequate for Discharge 03/04/2023 0826 by Curly Rim, RN Outcome: Progressing Goal: Diagnostic test results will improve Outcome: Adequate for Discharge Goal: Respiratory complications will improve 03/04/2023 1143 by Curly Rim, RN Outcome: Adequate for Discharge 03/04/2023 0826 by Curly Rim, RN Outcome: Progressing Goal: Cardiovascular complication will be avoided Outcome: Adequate for Discharge   Problem: Activity: Goal: Risk for activity intolerance will decrease 03/04/2023 1143 by Curly Rim, RN Outcome: Adequate for Discharge 03/04/2023 0826 by Curly Rim, RN Outcome: Progressing   Problem: Nutrition: Goal: Adequate nutrition will be maintained 03/04/2023 1143 by Curly Rim, RN Outcome: Adequate for Discharge 03/04/2023 0826 by Curly Rim, RN Outcome: Progressing   Problem: Coping: Goal: Level of anxiety will decrease 03/04/2023 1143 by Curly Rim, RN Outcome: Adequate for Discharge 03/04/2023 0826 by Curly Rim, RN Outcome:  Progressing   Problem: Elimination: Goal: Will not experience complications related to bowel motility Outcome: Adequate for Discharge Goal: Will not experience complications related to urinary retention Outcome: Adequate for Discharge   Problem: Pain Managment: Goal: General experience of comfort will improve 03/04/2023 1143 by Curly Rim, RN Outcome: Adequate for Discharge 03/04/2023 0826 by Curly Rim, RN Outcome: Progressing   Problem: Safety: Goal: Ability to remain free from injury will improve 03/04/2023 1143 by Curly Rim, RN Outcome: Adequate for Discharge 03/04/2023 0826 by Curly Rim, RN Outcome: Progressing   Problem: Skin Integrity: Goal: Risk for impaired skin integrity will decrease 03/04/2023 1143 by Curly Rim, RN Outcome: Adequate for Discharge 03/04/2023 0826 by Curly Rim, RN Outcome: Progressing

## 2023-03-04 NOTE — Progress Notes (Signed)
Patient was to be discharged yesterday however due to transportation issues/family issues patient unable to be discharged. Vital signs stable, labs stable.   Patient continues to improve clinically with resolution of small bowel obstruction. Patient stable for discharge.  No charge.

## 2023-03-04 NOTE — Plan of Care (Signed)
  Problem: Education: Goal: Knowledge of General Education information will improve Description Including pain rating scale, medication(s)/side effects and non-pharmacologic comfort measures Outcome: Progressing   Problem: Health Behavior/Discharge Planning: Goal: Ability to manage health-related needs will improve Outcome: Progressing   Problem: Clinical Measurements: Goal: Ability to maintain clinical measurements within normal limits will improve Outcome: Progressing Goal: Will remain free from infection Outcome: Progressing Goal: Respiratory complications will improve Outcome: Progressing   Problem: Activity: Goal: Risk for activity intolerance will decrease Outcome: Progressing   Problem: Nutrition: Goal: Adequate nutrition will be maintained Outcome: Progressing   Problem: Coping: Goal: Level of anxiety will decrease Outcome: Progressing   Problem: Pain Managment: Goal: General experience of comfort will improve Outcome: Progressing   Problem: Safety: Goal: Ability to remain free from injury will improve Outcome: Progressing   Problem: Skin Integrity: Goal: Risk for impaired skin integrity will decrease Outcome: Progressing   

## 2023-03-04 NOTE — Progress Notes (Signed)
Patient discharged home. AVS/Follow-up/Medications reviewed with patient and written copy given to patient. Patient verbalized understanding. All personal items taken with patient at discharge. Patient voiced no concerns or questions.

## 2023-07-06 ENCOUNTER — Emergency Department (HOSPITAL_COMMUNITY)
Admission: EM | Admit: 2023-07-06 | Discharge: 2023-07-06 | Discharge status: Against Medical Advice | Payer: Self-pay | Attending: Emergency Medicine | Admitting: Emergency Medicine

## 2023-07-06 ENCOUNTER — Other Ambulatory Visit: Payer: Self-pay

## 2023-07-06 ENCOUNTER — Encounter (HOSPITAL_COMMUNITY): Payer: Self-pay

## 2023-07-06 DIAGNOSIS — R109 Unspecified abdominal pain: Secondary | ICD-10-CM | POA: Insufficient documentation

## 2023-07-06 DIAGNOSIS — Z5321 Procedure and treatment not carried out due to patient leaving prior to being seen by health care provider: Secondary | ICD-10-CM | POA: Insufficient documentation

## 2023-07-06 NOTE — ED Triage Notes (Signed)
Pt c/o bilat flank painx2d. Pt denies urinary issues. Pt denies N/V

## 2023-07-06 NOTE — ED Notes (Signed)
Pt is going to pick up son from daycare states he will come back

## 2023-07-11 ENCOUNTER — Emergency Department (HOSPITAL_COMMUNITY): Payer: Self-pay

## 2023-07-11 ENCOUNTER — Other Ambulatory Visit: Payer: Self-pay

## 2023-07-11 ENCOUNTER — Emergency Department (HOSPITAL_COMMUNITY)
Admission: EM | Admit: 2023-07-11 | Discharge: 2023-07-11 | Discharge status: Against Medical Advice | Payer: Self-pay | Attending: Emergency Medicine | Admitting: Emergency Medicine

## 2023-07-11 DIAGNOSIS — R109 Unspecified abdominal pain: Secondary | ICD-10-CM | POA: Insufficient documentation

## 2023-07-11 DIAGNOSIS — Z5321 Procedure and treatment not carried out due to patient leaving prior to being seen by health care provider: Secondary | ICD-10-CM | POA: Insufficient documentation

## 2023-07-11 LAB — LIPASE, BLOOD: Lipase: 31 U/L (ref 11–51)

## 2023-07-11 LAB — COMPREHENSIVE METABOLIC PANEL WITH GFR
ALT: 14 U/L (ref 0–44)
AST: 17 U/L (ref 15–41)
Albumin: 3.8 g/dL (ref 3.5–5.0)
Alkaline Phosphatase: 52 U/L (ref 38–126)
Anion gap: 10 (ref 5–15)
BUN: 9 mg/dL (ref 6–20)
CO2: 24 mmol/L (ref 22–32)
Calcium: 8.8 mg/dL — ABNORMAL LOW (ref 8.9–10.3)
Chloride: 106 mmol/L (ref 98–111)
Creatinine, Ser: 0.94 mg/dL (ref 0.61–1.24)
GFR, Estimated: >60 mL/min (ref >60)
Glucose, Bld: 99 mg/dL (ref 70–99)
Potassium: 4.3 mmol/L (ref 3.5–5.1)
Sodium: 140 mmol/L (ref 135–145)
Total Bilirubin: 0.6 mg/dL (ref 0.3–1.2)
Total Protein: 6.4 g/dL — ABNORMAL LOW (ref 6.5–8.1)

## 2023-07-11 LAB — CBC
HCT: 47.6 % (ref 39.0–52.0)
Hemoglobin: 16.1 g/dL (ref 13.0–17.0)
MCH: 30.8 pg (ref 26.0–34.0)
MCHC: 33.8 g/dL (ref 30.0–36.0)
MCV: 91.2 fL (ref 80.0–100.0)
Platelets: 176 10*3/uL (ref 150–400)
RBC: 5.22 MIL/uL (ref 4.22–5.81)
RDW: 12 % (ref 11.5–15.5)
WBC: 5.4 10*3/uL (ref 4.0–10.5)
nRBC: 0 % (ref 0.0–0.2)

## 2023-07-11 NOTE — ED Notes (Signed)
Tech attempted to call pt. For repeat vitals w/ no response. Ct also called for pt w/ no response. MOVED TO [facility]

## 2023-07-11 NOTE — ED Triage Notes (Signed)
Patient reports pain at both sides of his abdomen onset Friday last week , denies emesis or diarrhea , no fever or chills . Last BM yesterday .

## 2023-07-11 NOTE — ED Notes (Signed)
CT tech called for patient multiple times with no response from lobby.

## 2024-10-23 ENCOUNTER — Emergency Department
Admission: EM | Admit: 2024-10-23 | Discharge: 2024-10-23 | Disposition: A | Discharge status: Home / Self Care | Payer: Self-pay | Attending: Emergency Medicine | Admitting: Emergency Medicine

## 2024-10-23 ENCOUNTER — Encounter: Payer: Self-pay | Admitting: Emergency Medicine

## 2024-10-23 ENCOUNTER — Other Ambulatory Visit: Payer: Self-pay

## 2024-10-23 DIAGNOSIS — K047 Periapical abscess without sinus: Secondary | ICD-10-CM | POA: Insufficient documentation

## 2024-10-23 MED ORDER — ACETAMINOPHEN 325 MG PO TABS
650.0000 mg | ORAL_TABLET | Freq: Once | ORAL | Status: AC
  Administered 2024-10-23: 650 mg via ORAL
  Filled 2024-10-23: qty 2

## 2024-10-23 MED ORDER — OXYCODONE-ACETAMINOPHEN 5-325 MG PO TABS
1.0000 | ORAL_TABLET | Freq: Once | ORAL | Status: AC
  Administered 2024-10-23: 1 via ORAL
  Filled 2024-10-23: qty 1

## 2024-10-23 MED ORDER — IBUPROFEN 800 MG PO TABS
800.0000 mg | ORAL_TABLET | Freq: Once | ORAL | Status: AC
  Administered 2024-10-23: 800 mg via ORAL
  Filled 2024-10-23: qty 1

## 2024-10-23 MED ORDER — AMOXICILLIN-POT CLAVULANATE 875-125 MG PO TABS: 1.0000 | ORAL_TABLET | Freq: Two times a day (BID) | ORAL | 0 refills | Status: AC

## 2024-10-23 MED ORDER — OXYCODONE-ACETAMINOPHEN 5-325 MG PO TABS: 1.0000 | ORAL_TABLET | Freq: Three times a day (TID) | ORAL | 0 refills | Status: AC | PRN

## 2024-10-23 MED ORDER — IBUPROFEN 800 MG PO TABS: 800.0000 mg | ORAL_TABLET | Freq: Three times a day (TID) | ORAL | 0 refills | Status: AC | PRN

## 2024-10-23 MED ORDER — AMOXICILLIN-POT CLAVULANATE 875-125 MG PO TABS
1.0000 | ORAL_TABLET | Freq: Once | ORAL | Status: AC
  Administered 2024-10-23: 1 via ORAL
  Filled 2024-10-23: qty 1

## 2024-10-23 NOTE — ED Triage Notes (Signed)
 Pt reports left sided dental pain for 5 days. Reports missing tooth and abscess to roof of mouth.
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
## 2024-10-23 NOTE — Discharge Instructions (Addendum)
 You have been seen in the Emergency Department (ED) today for dental pain.  Please take your prescribed antibiotic.  You may take pain medication as needed but ONLY as prescribed.  Do not work, drive, make any legal binding decisions, climb on any ladders or heights or drink alcohol while taking this medication.  You should also take over-the-counter pain medication such as ibuprofen  according to the label instructions unless a doctor has previously told you to avoid this type of medication (due to stomach ulcers, for example).  Alternatively you can take ibuprofen  600-800 mg by mouth three times daily with meals for no more than 5 days.  Please see you dentist as soon as possible; only a dentist will be able to fix your problem(s).  Please see below for dental follow up options.    I have also given you referral to establish care with primary care.  Please look through the list of resources and given their offices a call to establish care your earliest convenience.  Return to the ED if you develop worsening pain, fever, pus/drainage, difficulty breathing, unable to open your jaw, increased drooling, or other symptoms that concern you.
